# Patient Record
Sex: Female | Born: 1980 | Race: White | Hispanic: No | Marital: Single | Smoking: Current every day smoker
Health system: Southern US, Community
[De-identification: ages and names within clinical notes are randomized; demographics above are authoritative.]

## PROBLEM LIST (undated history)

## (undated) DIAGNOSIS — R011 Cardiac murmur, unspecified: Secondary | ICD-10-CM

## (undated) DIAGNOSIS — M1990 Unspecified osteoarthritis, unspecified site: Secondary | ICD-10-CM

## (undated) DIAGNOSIS — I499 Cardiac arrhythmia, unspecified: Secondary | ICD-10-CM

## (undated) DIAGNOSIS — IMO0002 Reserved for concepts with insufficient information to code with codable children: Secondary | ICD-10-CM

## (undated) DIAGNOSIS — G43909 Migraine, unspecified, not intractable, without status migrainosus: Secondary | ICD-10-CM

## (undated) DIAGNOSIS — M549 Dorsalgia, unspecified: Secondary | ICD-10-CM

## (undated) DIAGNOSIS — I10 Essential (primary) hypertension: Secondary | ICD-10-CM

---

## 1998-04-10 ENCOUNTER — Emergency Department (HOSPITAL_COMMUNITY): Admission: EM | Admit: 1998-04-10 | Discharge: 1998-04-10 | Payer: Self-pay | Admitting: Emergency Medicine

## 2000-03-27 ENCOUNTER — Emergency Department (HOSPITAL_COMMUNITY): Admission: EM | Admit: 2000-03-27 | Discharge: 2000-03-27 | Payer: Self-pay | Admitting: Emergency Medicine

## 2000-10-30 ENCOUNTER — Inpatient Hospital Stay (HOSPITAL_COMMUNITY): Admission: AD | Admit: 2000-10-30 | Discharge: 2000-10-30 | Payer: Self-pay | Admitting: Obstetrics

## 2000-11-01 ENCOUNTER — Inpatient Hospital Stay (HOSPITAL_COMMUNITY): Admission: AD | Admit: 2000-11-01 | Discharge: 2000-11-01 | Payer: Self-pay | Admitting: *Deleted

## 2001-01-22 ENCOUNTER — Emergency Department (HOSPITAL_COMMUNITY): Admission: EM | Admit: 2001-01-22 | Discharge: 2001-01-22 | Payer: Self-pay

## 2002-10-20 ENCOUNTER — Emergency Department (HOSPITAL_COMMUNITY): Admission: EM | Admit: 2002-10-20 | Discharge: 2002-10-20 | Payer: Self-pay | Admitting: *Deleted

## 2004-03-15 ENCOUNTER — Emergency Department (HOSPITAL_COMMUNITY): Admission: EM | Admit: 2004-03-15 | Discharge: 2004-03-15 | Payer: Self-pay | Admitting: Emergency Medicine

## 2004-11-21 ENCOUNTER — Emergency Department: Payer: Self-pay | Admitting: Emergency Medicine

## 2004-11-30 ENCOUNTER — Ambulatory Visit: Payer: Self-pay | Admitting: Family Medicine

## 2004-12-09 ENCOUNTER — Emergency Department (HOSPITAL_COMMUNITY): Admission: EM | Admit: 2004-12-09 | Discharge: 2004-12-09 | Payer: Self-pay | Admitting: Family Medicine

## 2004-12-14 ENCOUNTER — Ambulatory Visit: Payer: Self-pay | Admitting: Family Medicine

## 2004-12-27 ENCOUNTER — Ambulatory Visit: Payer: Self-pay | Admitting: Internal Medicine

## 2004-12-28 ENCOUNTER — Ambulatory Visit: Payer: Self-pay | Admitting: Family Medicine

## 2005-01-18 ENCOUNTER — Ambulatory Visit: Payer: Self-pay | Admitting: Family Medicine

## 2005-02-21 ENCOUNTER — Emergency Department (HOSPITAL_COMMUNITY): Admission: EM | Admit: 2005-02-21 | Discharge: 2005-02-21 | Payer: Self-pay | Admitting: Emergency Medicine

## 2005-03-15 ENCOUNTER — Emergency Department (HOSPITAL_COMMUNITY): Admission: EM | Admit: 2005-03-15 | Discharge: 2005-03-15 | Payer: Self-pay | Admitting: Family Medicine

## 2005-05-04 ENCOUNTER — Emergency Department (HOSPITAL_COMMUNITY): Admission: EM | Admit: 2005-05-04 | Discharge: 2005-05-04 | Payer: Self-pay | Admitting: *Deleted

## 2005-06-08 ENCOUNTER — Emergency Department (HOSPITAL_COMMUNITY): Admission: EM | Admit: 2005-06-08 | Discharge: 2005-06-08 | Payer: Self-pay | Admitting: Emergency Medicine

## 2005-06-10 ENCOUNTER — Emergency Department (HOSPITAL_COMMUNITY): Admission: EM | Admit: 2005-06-10 | Discharge: 2005-06-10 | Payer: Self-pay | Admitting: Family Medicine

## 2005-09-09 ENCOUNTER — Emergency Department: Payer: Self-pay | Admitting: Emergency Medicine

## 2005-09-10 ENCOUNTER — Ambulatory Visit: Payer: Self-pay | Admitting: Emergency Medicine

## 2005-09-23 ENCOUNTER — Emergency Department (HOSPITAL_COMMUNITY): Admission: EM | Admit: 2005-09-23 | Discharge: 2005-09-23 | Payer: Self-pay | Admitting: Emergency Medicine

## 2006-03-23 ENCOUNTER — Emergency Department (HOSPITAL_COMMUNITY): Admission: EM | Admit: 2006-03-23 | Discharge: 2006-03-23 | Payer: Self-pay | Admitting: Family Medicine

## 2006-07-31 ENCOUNTER — Emergency Department (HOSPITAL_COMMUNITY): Admission: EM | Admit: 2006-07-31 | Discharge: 2006-07-31 | Payer: Self-pay | Admitting: Family Medicine

## 2006-11-17 ENCOUNTER — Emergency Department (HOSPITAL_COMMUNITY): Admission: EM | Admit: 2006-11-17 | Discharge: 2006-11-17 | Payer: Self-pay | Admitting: Family Medicine

## 2007-04-22 ENCOUNTER — Emergency Department (HOSPITAL_COMMUNITY): Admission: EM | Admit: 2007-04-22 | Discharge: 2007-04-22 | Payer: Self-pay | Admitting: *Deleted

## 2007-08-31 ENCOUNTER — Ambulatory Visit: Payer: Self-pay | Admitting: Internal Medicine

## 2007-08-31 LAB — CONVERTED CEMR LAB
ALT: 12 units/L (ref 0–35)
Albumin: 4.4 g/dL (ref 3.5–5.2)
Alkaline Phosphatase: 98 units/L (ref 39–117)
BUN: 12 mg/dL (ref 6–23)
Basophils Absolute: 0 10*3/uL (ref 0.0–0.1)
CO2: 23 meq/L (ref 19–32)
Calcium: 9.4 mg/dL (ref 8.4–10.5)
Eosinophils Absolute: 0.1 10*3/uL (ref 0.0–0.7)
Eosinophils Relative: 2 % (ref 0–5)
Glucose, Bld: 95 mg/dL (ref 70–99)
HCT: 40.4 % (ref 36.0–46.0)
Lymphs Abs: 2.6 10*3/uL (ref 0.7–3.3)
MCHC: 31.9 g/dL (ref 30.0–36.0)
Monocytes Relative: 5 % (ref 3–11)
Potassium: 3.9 meq/L (ref 3.5–5.3)
RBC: 4.79 M/uL (ref 3.87–5.11)
TSH: 2.485 microintl units/mL (ref 0.350–5.50)
Total CHOL/HDL Ratio: 5.3
Total Protein: 7 g/dL (ref 6.0–8.3)
Triglycerides: 507 mg/dL — ABNORMAL HIGH (ref <150)

## 2007-09-02 ENCOUNTER — Ambulatory Visit: Payer: Self-pay | Admitting: *Deleted

## 2007-10-01 ENCOUNTER — Ambulatory Visit: Payer: Self-pay | Admitting: Internal Medicine

## 2007-10-01 ENCOUNTER — Ambulatory Visit (HOSPITAL_COMMUNITY): Admission: RE | Admit: 2007-10-01 | Discharge: 2007-10-01 | Payer: Self-pay | Admitting: Internal Medicine

## 2007-11-09 ENCOUNTER — Ambulatory Visit: Payer: Self-pay | Admitting: Internal Medicine

## 2007-11-24 ENCOUNTER — Ambulatory Visit: Payer: Self-pay | Admitting: Internal Medicine

## 2007-11-24 LAB — CONVERTED CEMR LAB
LDL Cholesterol: 81 mg/dL (ref 0–99)
Triglycerides: 218 mg/dL — ABNORMAL HIGH (ref <150)
VLDL: 44 mg/dL — ABNORMAL HIGH (ref 0–40)

## 2007-12-14 ENCOUNTER — Ambulatory Visit: Payer: Self-pay | Admitting: Internal Medicine

## 2007-12-14 LAB — CONVERTED CEMR LAB: GC Probe Amp, Genital: NEGATIVE

## 2008-01-14 ENCOUNTER — Ambulatory Visit: Payer: Self-pay | Admitting: Family Medicine

## 2008-02-03 ENCOUNTER — Encounter: Admission: RE | Admit: 2008-02-03 | Discharge: 2008-04-19 | Payer: Self-pay | Admitting: Family Medicine

## 2008-02-15 ENCOUNTER — Ambulatory Visit: Payer: Self-pay | Admitting: Internal Medicine

## 2008-02-18 ENCOUNTER — Ambulatory Visit: Payer: Self-pay | Admitting: Internal Medicine

## 2008-03-14 ENCOUNTER — Ambulatory Visit: Payer: Self-pay | Admitting: Internal Medicine

## 2008-05-22 ENCOUNTER — Emergency Department (HOSPITAL_COMMUNITY): Admission: EM | Admit: 2008-05-22 | Discharge: 2008-05-22 | Payer: Self-pay | Admitting: Family Medicine

## 2008-05-26 ENCOUNTER — Ambulatory Visit: Payer: Self-pay | Admitting: Internal Medicine

## 2008-06-30 ENCOUNTER — Ambulatory Visit: Payer: Self-pay | Admitting: Internal Medicine

## 2008-06-30 LAB — CONVERTED CEMR LAB
Chlamydia, Swab/Urine, PCR: NEGATIVE
GC Probe Amp, Urine: NEGATIVE

## 2008-07-06 ENCOUNTER — Ambulatory Visit (HOSPITAL_COMMUNITY): Admission: RE | Admit: 2008-07-06 | Discharge: 2008-07-06 | Payer: Self-pay | Admitting: Internal Medicine

## 2008-11-15 ENCOUNTER — Encounter (INDEPENDENT_AMBULATORY_CARE_PROVIDER_SITE_OTHER): Payer: Self-pay | Admitting: Adult Health

## 2008-11-15 ENCOUNTER — Ambulatory Visit: Payer: Self-pay | Admitting: Internal Medicine

## 2008-11-15 LAB — CONVERTED CEMR LAB
ALT: 12 units/L (ref 0–35)
Albumin: 4.3 g/dL (ref 3.5–5.2)
Alkaline Phosphatase: 80 units/L (ref 39–117)
CO2: 25 meq/L (ref 19–32)
Chloride: 107 meq/L (ref 96–112)
Cholesterol: 153 mg/dL (ref 0–200)
Creatinine, Ser: 0.74 mg/dL (ref 0.40–1.20)
Glucose, Bld: 89 mg/dL (ref 70–99)
Hemoglobin: 12.8 g/dL (ref 12.0–15.0)
LDL Cholesterol: 76 mg/dL (ref 0–99)
Lymphocytes Relative: 30 % (ref 12–46)
MCHC: 32 g/dL (ref 30.0–36.0)
MCV: 87.1 fL (ref 78.0–100.0)
Monocytes Absolute: 0.4 10*3/uL (ref 0.1–1.0)
Monocytes Relative: 7 % (ref 3–12)
RBC: 4.59 M/uL (ref 3.87–5.11)
Sodium: 143 meq/L (ref 135–145)
Total CHOL/HDL Ratio: 4.8
VLDL: 45 mg/dL — ABNORMAL HIGH (ref 0–40)

## 2008-11-22 ENCOUNTER — Ambulatory Visit (HOSPITAL_COMMUNITY): Admission: RE | Admit: 2008-11-22 | Discharge: 2008-11-22 | Payer: Self-pay | Admitting: Internal Medicine

## 2008-12-01 ENCOUNTER — Encounter: Admission: RE | Admit: 2008-12-01 | Discharge: 2008-12-01 | Payer: Self-pay | Admitting: Internal Medicine

## 2009-03-07 ENCOUNTER — Encounter (INDEPENDENT_AMBULATORY_CARE_PROVIDER_SITE_OTHER): Payer: Self-pay | Admitting: Adult Health

## 2009-03-07 ENCOUNTER — Ambulatory Visit: Payer: Self-pay | Admitting: Internal Medicine

## 2009-03-09 ENCOUNTER — Ambulatory Visit: Payer: Self-pay | Admitting: Internal Medicine

## 2009-03-21 ENCOUNTER — Ambulatory Visit: Payer: Self-pay | Admitting: Internal Medicine

## 2009-04-02 ENCOUNTER — Emergency Department (HOSPITAL_COMMUNITY): Admission: EM | Admit: 2009-04-02 | Discharge: 2009-04-02 | Payer: Self-pay | Admitting: Emergency Medicine

## 2009-05-11 ENCOUNTER — Ambulatory Visit: Payer: Self-pay | Admitting: Internal Medicine

## 2009-05-15 ENCOUNTER — Emergency Department (HOSPITAL_COMMUNITY): Admission: EM | Admit: 2009-05-15 | Discharge: 2009-05-15 | Payer: Self-pay | Admitting: Emergency Medicine

## 2009-06-28 ENCOUNTER — Emergency Department (HOSPITAL_COMMUNITY): Admission: EM | Admit: 2009-06-28 | Discharge: 2009-06-28 | Payer: Self-pay | Admitting: Family Medicine

## 2009-08-10 ENCOUNTER — Emergency Department (HOSPITAL_COMMUNITY): Admission: EM | Admit: 2009-08-10 | Discharge: 2009-08-10 | Payer: Self-pay | Admitting: Emergency Medicine

## 2009-08-25 ENCOUNTER — Emergency Department (HOSPITAL_COMMUNITY): Admission: EM | Admit: 2009-08-25 | Discharge: 2009-08-25 | Payer: Self-pay | Admitting: Emergency Medicine

## 2009-10-18 ENCOUNTER — Emergency Department (HOSPITAL_COMMUNITY): Admission: EM | Admit: 2009-10-18 | Discharge: 2009-10-18 | Payer: Self-pay | Admitting: Emergency Medicine

## 2009-11-04 ENCOUNTER — Emergency Department (HOSPITAL_COMMUNITY): Admission: EM | Admit: 2009-11-04 | Discharge: 2009-11-04 | Payer: Self-pay | Admitting: Emergency Medicine

## 2009-12-12 ENCOUNTER — Encounter (INDEPENDENT_AMBULATORY_CARE_PROVIDER_SITE_OTHER): Payer: Self-pay | Admitting: Adult Health

## 2009-12-12 ENCOUNTER — Ambulatory Visit: Payer: Self-pay | Admitting: Internal Medicine

## 2009-12-12 LAB — CONVERTED CEMR LAB: hCG, Beta Chain, Quant, S: 1889 milliintl units/mL

## 2009-12-21 ENCOUNTER — Encounter: Admission: RE | Admit: 2009-12-21 | Discharge: 2009-12-21 | Payer: Self-pay | Admitting: Internal Medicine

## 2010-01-04 ENCOUNTER — Emergency Department (HOSPITAL_COMMUNITY): Admission: EM | Admit: 2010-01-04 | Discharge: 2010-01-04 | Payer: Self-pay | Admitting: Emergency Medicine

## 2010-01-22 ENCOUNTER — Emergency Department (HOSPITAL_COMMUNITY): Admission: EM | Admit: 2010-01-22 | Discharge: 2010-01-23 | Payer: Self-pay | Admitting: Emergency Medicine

## 2010-02-23 ENCOUNTER — Ambulatory Visit: Payer: Self-pay | Admitting: Internal Medicine

## 2010-03-16 ENCOUNTER — Ambulatory Visit: Payer: Self-pay | Admitting: Family Medicine

## 2010-05-07 ENCOUNTER — Emergency Department (HOSPITAL_COMMUNITY)
Admission: EM | Admit: 2010-05-07 | Discharge: 2010-05-07 | Payer: Self-pay | Source: Home / Self Care | Admitting: Emergency Medicine

## 2010-12-26 ENCOUNTER — Ambulatory Visit (HOSPITAL_COMMUNITY)
Admission: RE | Admit: 2010-12-26 | Discharge: 2010-12-26 | Payer: Self-pay | Source: Home / Self Care | Attending: Family Medicine | Admitting: Family Medicine

## 2011-01-13 ENCOUNTER — Encounter: Payer: Self-pay | Admitting: Internal Medicine

## 2011-01-25 ENCOUNTER — Encounter: Payer: Self-pay | Admitting: Internal Medicine

## 2011-01-25 ENCOUNTER — Other Ambulatory Visit: Payer: Self-pay | Admitting: Family Medicine

## 2011-01-25 ENCOUNTER — Encounter (INDEPENDENT_AMBULATORY_CARE_PROVIDER_SITE_OTHER): Payer: Self-pay | Admitting: *Deleted

## 2011-01-25 LAB — CONVERTED CEMR LAB
Chlamydia, DNA Probe: NEGATIVE
GC Probe Amp, Genital: NEGATIVE

## 2011-02-25 ENCOUNTER — Inpatient Hospital Stay (INDEPENDENT_AMBULATORY_CARE_PROVIDER_SITE_OTHER)
Admission: RE | Admit: 2011-02-25 | Discharge: 2011-02-25 | Disposition: A | Discharge status: Home / Self Care | Payer: Self-pay | Source: Ambulatory Visit | Attending: Family Medicine | Admitting: Family Medicine

## 2011-02-25 ENCOUNTER — Ambulatory Visit (HOSPITAL_COMMUNITY)
Admission: RE | Admit: 2011-02-25 | Discharge: 2011-02-25 | Disposition: A | Discharge status: Home / Self Care | Payer: Self-pay | Source: Ambulatory Visit | Attending: Family Medicine | Admitting: Family Medicine

## 2011-02-25 ENCOUNTER — Other Ambulatory Visit (HOSPITAL_COMMUNITY): Payer: Self-pay | Admitting: Family Medicine

## 2011-02-25 ENCOUNTER — Ambulatory Visit (HOSPITAL_COMMUNITY): Payer: Self-pay

## 2011-02-25 DIAGNOSIS — M79609 Pain in unspecified limb: Secondary | ICD-10-CM | POA: Insufficient documentation

## 2011-02-25 DIAGNOSIS — R52 Pain, unspecified: Secondary | ICD-10-CM

## 2011-02-25 DIAGNOSIS — R1013 Epigastric pain: Secondary | ICD-10-CM

## 2011-02-25 DIAGNOSIS — M7730 Calcaneal spur, unspecified foot: Secondary | ICD-10-CM | POA: Insufficient documentation

## 2011-02-25 LAB — POCT URINALYSIS DIPSTICK
Hgb urine dipstick: NEGATIVE
Ketones, ur: NEGATIVE mg/dL
Nitrite: NEGATIVE
Urobilinogen, UA: 0.2 mg/dL (ref 0.0–1.0)

## 2011-02-25 LAB — OCCULT BLOOD, POC DEVICE: Fecal Occult Bld: NEGATIVE

## 2011-03-10 LAB — CBC
HCT: 33.5 % — ABNORMAL LOW (ref 36.0–46.0)
Hemoglobin: 11.1 g/dL — ABNORMAL LOW (ref 12.0–15.0)
MCHC: 33.2 g/dL (ref 30.0–36.0)
MCV: 85 fL (ref 78.0–100.0)
RBC: 3.94 MIL/uL (ref 3.87–5.11)
WBC: 8.8 10*3/uL (ref 4.0–10.5)

## 2011-03-10 LAB — COMPREHENSIVE METABOLIC PANEL
CO2: 28 mEq/L (ref 19–32)
Calcium: 8.7 mg/dL (ref 8.4–10.5)
Chloride: 105 mEq/L (ref 96–112)
Creatinine, Ser: 0.89 mg/dL (ref 0.4–1.2)
GFR calc non Af Amer: >60 mL/min (ref >60)
Glucose, Bld: 112 mg/dL — ABNORMAL HIGH (ref 70–99)
Total Bilirubin: 0.1 mg/dL — ABNORMAL LOW (ref 0.3–1.2)

## 2011-03-10 LAB — URINALYSIS, ROUTINE W REFLEX MICROSCOPIC
Leukocytes, UA: NEGATIVE
Nitrite: NEGATIVE
Protein, ur: 30 mg/dL — AB
Urobilinogen, UA: 0.2 mg/dL (ref 0.0–1.0)
pH: 6 (ref 5.0–8.0)

## 2011-03-10 LAB — DIFFERENTIAL
Basophils Absolute: 0 10*3/uL (ref 0.0–0.1)
Eosinophils Absolute: 0.1 10*3/uL (ref 0.0–0.7)
Eosinophils Relative: 1 % (ref 0–5)
Lymphocytes Relative: 20 % (ref 12–46)
Lymphs Abs: 1.7 10*3/uL (ref 0.7–4.0)
Neutrophils Relative %: 74 % (ref 43–77)

## 2011-03-10 LAB — URINE MICROSCOPIC-ADD ON

## 2011-03-10 LAB — LIPASE, BLOOD: Lipase: 16 U/L (ref 11–59)

## 2011-03-19 ENCOUNTER — Encounter (INDEPENDENT_AMBULATORY_CARE_PROVIDER_SITE_OTHER): Payer: Self-pay | Admitting: *Deleted

## 2011-03-19 LAB — CONVERTED CEMR LAB
ALT: 9 units/L (ref 0–35)
AST: 11 units/L (ref 0–37)
Alkaline Phosphatase: 70 units/L (ref 39–117)
Creatinine, Ser: 0.8 mg/dL (ref 0.40–1.20)
HCT: 37.9 % (ref 36.0–46.0)
MCV: 86.3 fL (ref 78.0–100.0)
Platelets: 329 10*3/uL (ref 150–400)
RDW: 14.6 % (ref 11.5–15.5)
Sodium: 141 meq/L (ref 135–145)
Total Bilirubin: 0.2 mg/dL — ABNORMAL LOW (ref 0.3–1.2)
Total Protein: 6.7 g/dL (ref 6.0–8.3)
WBC: 8 10*3/uL (ref 4.0–10.5)

## 2011-03-22 ENCOUNTER — Emergency Department (HOSPITAL_COMMUNITY): Payer: Self-pay

## 2011-03-22 ENCOUNTER — Emergency Department (HOSPITAL_COMMUNITY)
Admission: EM | Admit: 2011-03-22 | Discharge: 2011-03-22 | Disposition: A | Discharge status: Home / Self Care | Payer: Self-pay | Attending: Emergency Medicine | Admitting: Emergency Medicine

## 2011-03-22 DIAGNOSIS — Z79899 Other long term (current) drug therapy: Secondary | ICD-10-CM | POA: Insufficient documentation

## 2011-03-22 DIAGNOSIS — F411 Generalized anxiety disorder: Secondary | ICD-10-CM | POA: Insufficient documentation

## 2011-03-22 DIAGNOSIS — R079 Chest pain, unspecified: Secondary | ICD-10-CM | POA: Insufficient documentation

## 2011-03-22 DIAGNOSIS — G8929 Other chronic pain: Secondary | ICD-10-CM | POA: Insufficient documentation

## 2011-03-22 DIAGNOSIS — M549 Dorsalgia, unspecified: Secondary | ICD-10-CM | POA: Insufficient documentation

## 2011-03-22 DIAGNOSIS — F17200 Nicotine dependence, unspecified, uncomplicated: Secondary | ICD-10-CM | POA: Insufficient documentation

## 2011-03-22 DIAGNOSIS — R0602 Shortness of breath: Secondary | ICD-10-CM | POA: Insufficient documentation

## 2011-03-22 LAB — POCT I-STAT, CHEM 8
BUN: 11 mg/dL (ref 6–23)
Calcium, Ion: 1.16 mmol/L (ref 1.12–1.32)
Chloride: 104 mEq/L (ref 96–112)
HCT: 38 % (ref 36.0–46.0)
Sodium: 141 mEq/L (ref 135–145)
TCO2: 25 mmol/L (ref 0–100)

## 2011-03-22 LAB — POCT CARDIAC MARKERS: Myoglobin, poc: 41.7 ng/mL (ref 12–200)

## 2011-03-27 LAB — CBC
HCT: 36.5 % (ref 36.0–46.0)
Hemoglobin: 12.7 g/dL (ref 12.0–15.0)
MCHC: 34.8 g/dL (ref 30.0–36.0)
MCV: 84.6 fL (ref 78.0–100.0)
Platelets: 288 10*3/uL (ref 150–400)
RDW: 14.1 % (ref 11.5–15.5)

## 2011-03-27 LAB — DIFFERENTIAL
Basophils Absolute: 0 10*3/uL (ref 0.0–0.1)
Basophils Relative: 1 % (ref 0–1)
Eosinophils Absolute: 0.1 10*3/uL (ref 0.0–0.7)
Eosinophils Relative: 2 % (ref 0–5)
Lymphocytes Relative: 27 % (ref 12–46)
Monocytes Absolute: 0.3 10*3/uL (ref 0.1–1.0)

## 2011-03-27 LAB — POCT I-STAT, CHEM 8
BUN: 8 mg/dL (ref 6–23)
Calcium, Ion: 1.16 mmol/L (ref 1.12–1.32)
Hemoglobin: 13.3 g/dL (ref 12.0–15.0)
TCO2: 25 mmol/L (ref 0–100)

## 2011-03-27 LAB — POCT PREGNANCY, URINE: Preg Test, Ur: POSITIVE

## 2011-03-27 LAB — POCT CARDIAC MARKERS: Troponin i, poc: <0.05 ng/mL (ref 0.00–0.09)

## 2011-03-28 LAB — RAPID STREP SCREEN (MED CTR MEBANE ONLY): Streptococcus, Group A Screen (Direct): NEGATIVE

## 2011-03-30 LAB — WET PREP, GENITAL: Trich, Wet Prep: NONE SEEN

## 2011-03-30 LAB — GC/CHLAMYDIA PROBE AMP, GENITAL: Chlamydia, DNA Probe: NEGATIVE

## 2011-05-01 ENCOUNTER — Emergency Department (HOSPITAL_COMMUNITY)
Admission: EM | Admit: 2011-05-01 | Discharge: 2011-05-01 | Disposition: A | Discharge status: Home / Self Care | Payer: Self-pay | Attending: Emergency Medicine | Admitting: Emergency Medicine

## 2011-05-01 ENCOUNTER — Emergency Department (HOSPITAL_COMMUNITY): Payer: Self-pay

## 2011-05-01 DIAGNOSIS — M549 Dorsalgia, unspecified: Secondary | ICD-10-CM | POA: Insufficient documentation

## 2011-05-01 DIAGNOSIS — IMO0002 Reserved for concepts with insufficient information to code with codable children: Secondary | ICD-10-CM | POA: Insufficient documentation

## 2011-05-01 DIAGNOSIS — G8929 Other chronic pain: Secondary | ICD-10-CM | POA: Insufficient documentation

## 2011-05-01 DIAGNOSIS — W19XXXA Unspecified fall, initial encounter: Secondary | ICD-10-CM | POA: Insufficient documentation

## 2011-05-01 DIAGNOSIS — M79609 Pain in unspecified limb: Secondary | ICD-10-CM | POA: Insufficient documentation

## 2012-02-07 ENCOUNTER — Encounter (HOSPITAL_COMMUNITY): Payer: Self-pay | Admitting: Emergency Medicine

## 2012-02-07 ENCOUNTER — Emergency Department (INDEPENDENT_AMBULATORY_CARE_PROVIDER_SITE_OTHER)
Admission: EM | Admit: 2012-02-07 | Discharge: 2012-02-07 | Disposition: A | Discharge status: Home / Self Care | Payer: Self-pay | Source: Home / Self Care | Attending: Family Medicine | Admitting: Family Medicine

## 2012-02-07 DIAGNOSIS — J329 Chronic sinusitis, unspecified: Secondary | ICD-10-CM

## 2012-02-07 DIAGNOSIS — J111 Influenza due to unidentified influenza virus with other respiratory manifestations: Secondary | ICD-10-CM

## 2012-02-07 DIAGNOSIS — F17200 Nicotine dependence, unspecified, uncomplicated: Secondary | ICD-10-CM

## 2012-02-07 MED ORDER — GUAIFENESIN ER 600 MG PO TB12: 1200.0000 mg | ORAL_TABLET | Freq: Two times a day (BID) | ORAL | Status: DC

## 2012-02-07 MED ORDER — LEVOFLOXACIN 500 MG PO TABS: 500.0000 mg | ORAL_TABLET | Freq: Every day | ORAL | Status: AC

## 2012-02-07 NOTE — ED Provider Notes (Signed)
History     CSN: [id number]  Arrival date & time 02/07/12  1151   First MD Initiated Contact with Patient 02/07/12 1201      Chief Complaint  Patient presents with  . Sinusitis  . URI    (Consider location/radiation/quality/duration/timing/severity/associated sxs/prior treatment) Patient is a 31 y.o. female presenting with sinusitis and URI. The history is provided by the patient.  Sinusitis  This is a chronic problem. The current episode started more than 2 days ago. The problem has not changed since onset.There has been no fever. The patient is experiencing no pain. Associated symptoms include congestion, sore throat and cough. Associated symptoms comments: Pt is smoker.. The treatment provided no relief.  URI The primary symptoms include sore throat and cough.  Symptoms associated with the illness include congestion and rhinorrhea.    History reviewed. No pertinent past medical history.  History reviewed. No pertinent past surgical history.  No family history on file.  History  Substance Use Topics  . Smoking status: Current Everyday Smoker  . Smokeless tobacco: Not on file  . Alcohol Use: Yes    OB History    Grav Para Term Preterm Abortions TAB SAB Ect Mult Living                  Review of Systems  Constitutional: Negative.   HENT: Positive for congestion, sore throat, rhinorrhea and postnasal drip.   Respiratory: Positive for cough.   Gastrointestinal: Negative.   Genitourinary: Negative.     Allergies  Amoxicillin; Clindamycin/lincomycin; Doxycycline; Flagyl; Neurontin; Penicillins; Prednisone; Septra; Tramadol; and Ultram  Home Medications   Current Outpatient Rx  Name Route Sig Dispense Refill  . HYDROCODONE-ACETAMINOPHEN 5-325 MG PO TABS Oral Take 1 tablet by mouth every 6 (six) hours as needed.    . GUAIFENESIN ER 600 MG PO TB12 Oral Take 2 tablets (1,200 mg total) by mouth 2 (two) times daily. 30 tablet 1  . LEVOFLOXACIN 500 MG PO TABS Oral  Take 1 tablet (500 mg total) by mouth daily. 10 tablet 0    BP 120/70  Pulse 82  Temp(Src) 98.7 F (37.1 C) (Oral)  Resp 16  SpO2 98%  LMP 01/31/2012  Physical Exam  Nursing note and vitals reviewed. Constitutional: She is oriented to person, place, and time. She appears well-developed and well-nourished.       Smells of smoking.  HENT:  Head: Normocephalic.  Right Ear: External ear normal.  Left Ear: External ear normal.  Nose: Nose normal.  Mouth/Throat: Oropharynx is clear and moist.  Eyes: EOM are normal. Pupils are equal, round, and reactive to light.  Neck: Normal range of motion. Neck supple.  Cardiovascular: Normal rate, regular rhythm, normal heart sounds and intact distal pulses.   Pulmonary/Chest: She has rhonchi.  Abdominal: Soft. Bowel sounds are normal.  Lymphadenopathy:    She has no cervical adenopathy.  Neurological: She is alert and oriented to person, place, and time.  Skin: Skin is warm and dry.    ED Course  Procedures (including critical care time)  Labs Reviewed - No data to display No results found.   1. Sinusitis   2. Bronchitis with flu       MDM          Barkley Bruns, MD 02/22/12 [phone]

## 2012-02-07 NOTE — ED Notes (Signed)
Here with uri sx that started on Sunday with cough,sore throat and cough but has progressed with chest congestion,sinus pressure and greenish mucous,body aches.afebrile.pt tried otc mucinex and cold meds but not working

## 2012-05-29 ENCOUNTER — Other Ambulatory Visit (HOSPITAL_COMMUNITY): Payer: Self-pay | Admitting: Family Medicine

## 2012-05-29 ENCOUNTER — Other Ambulatory Visit: Payer: Self-pay | Admitting: Family Medicine

## 2012-05-29 ENCOUNTER — Ambulatory Visit
Admission: RE | Admit: 2012-05-29 | Discharge: 2012-05-29 | Disposition: A | Discharge status: Home / Self Care | Payer: No Typology Code available for payment source | Source: Ambulatory Visit | Attending: Family Medicine | Admitting: Family Medicine

## 2012-05-29 ENCOUNTER — Ambulatory Visit (HOSPITAL_COMMUNITY)
Admission: RE | Admit: 2012-05-29 | Discharge: 2012-05-29 | Disposition: A | Discharge status: Home / Self Care | Payer: Self-pay | Source: Ambulatory Visit | Attending: Family Medicine | Admitting: Family Medicine

## 2012-05-29 DIAGNOSIS — M25559 Pain in unspecified hip: Secondary | ICD-10-CM

## 2012-05-29 DIAGNOSIS — M545 Low back pain, unspecified: Secondary | ICD-10-CM

## 2012-05-29 DIAGNOSIS — R52 Pain, unspecified: Secondary | ICD-10-CM

## 2012-05-31 ENCOUNTER — Other Ambulatory Visit: Payer: Self-pay

## 2012-08-21 ENCOUNTER — Encounter (HOSPITAL_COMMUNITY): Payer: Self-pay

## 2012-08-21 ENCOUNTER — Emergency Department (HOSPITAL_COMMUNITY)
Admission: EM | Admit: 2012-08-21 | Discharge: 2012-08-21 | Disposition: A | Discharge status: Home / Self Care | Payer: Self-pay | Attending: Emergency Medicine | Admitting: Emergency Medicine

## 2012-08-21 DIAGNOSIS — L600 Ingrowing nail: Secondary | ICD-10-CM | POA: Insufficient documentation

## 2012-08-21 DIAGNOSIS — Z880 Allergy status to penicillin: Secondary | ICD-10-CM | POA: Insufficient documentation

## 2012-08-21 DIAGNOSIS — F17200 Nicotine dependence, unspecified, uncomplicated: Secondary | ICD-10-CM | POA: Insufficient documentation

## 2012-08-21 DIAGNOSIS — M129 Arthropathy, unspecified: Secondary | ICD-10-CM | POA: Insufficient documentation

## 2012-08-21 DIAGNOSIS — IMO0002 Reserved for concepts with insufficient information to code with codable children: Secondary | ICD-10-CM | POA: Insufficient documentation

## 2012-08-21 DIAGNOSIS — Z887 Allergy status to serum and vaccine status: Secondary | ICD-10-CM | POA: Insufficient documentation

## 2012-08-21 HISTORY — DX: Unspecified osteoarthritis, unspecified site: M19.90

## 2012-08-21 HISTORY — DX: Reserved for concepts with insufficient information to code with codable children: IMO0002

## 2012-08-21 HISTORY — DX: Dorsalgia, unspecified: M54.9

## 2012-08-21 MED ORDER — HYDROCODONE-ACETAMINOPHEN 5-325 MG PO TABS: 1.0000 | ORAL_TABLET | Freq: Four times a day (QID) | ORAL | Status: AC | PRN

## 2012-08-21 MED ORDER — LEVOFLOXACIN 250 MG PO TABS: 500.0000 mg | ORAL_TABLET | Freq: Every day | ORAL | Status: AC

## 2012-08-21 NOTE — ED Notes (Signed)
Pt insists she has been taking Vicodin for her back pain but ran out and unable to get a refill d/t her PMD office closing.  She says she is not allergic to tylenol or motrin either.

## 2012-08-21 NOTE — Discharge Instructions (Signed)
°  Infected Ingrown Toenail An infected ingrown toenail occurs when the nail edge grows into the skin and bacteria invade the area. Symptoms include pain, tenderness, swelling, and pus drainage from the edge of the nail. Poorly fitting shoes, minor injuries, and improper cutting of the toenail may also contribute to the problem. You should cut your toenails squarely instead of rounding the edges. Do not cut them too short. Avoid tight or pointed toe shoes. Sometimes the ingrown portion of the nail must be removed. If your toenail is removed, it can take 3-4 months for it to re-grow. HOME CARE INSTRUCTIONS   Soak your infected toe in warm water for 20-30 minutes, 2 to 3 times a day.   Packing or dressings applied to the area should be changed daily.   Take medicine as directed and finish them.   Reduce activities and keep your foot elevated when able to reduce swelling and discomfort. Do this until the infection gets better.   Wear sandals or go barefoot as much as possible while the infected area is sensitive.   See your caregiver for follow-up care in 2-3 days if the infection is not better.  SEEK MEDICAL CARE IF:  Your toe is becoming more red, swollen or painful. MAKE SURE YOU:   Understand these instructions.   Will watch your condition.   Will get help right away if you are not doing well or get worse.  Document Released: 01/16/2005 Document Revised: 11/28/2011 Document Reviewed: 12/05/2008

## 2012-08-21 NOTE — ED Provider Notes (Signed)
History     CSN: [id number]  Arrival date & time 08/21/12  1415   First MD Initiated Contact with Patient 08/21/12 1505      Chief Complaint  Patient presents with  . Toe Pain    (Consider location/radiation/quality/duration/timing/severity/associated sxs/prior treatment) HPI Comments: Pt to ER c/o right ingrown toenail of great toe. Pt has been soaking with epsom salt without relief.   Patient is a 31 y.o. female presenting with toe pain.  Toe Pain This is a recurrent problem. The current episode started more than 1 month ago. The problem occurs constantly. The problem has been gradually worsening. Pertinent negatives include no chills, congestion, coughing, diaphoresis, fever, headaches, myalgias, neck pain, numbness or weakness.    Past Medical History  Diagnosis Date  . Back pain   . Arthritis   . DDD (degenerative disc disease)     No past surgical history on file.  No family history on file.  History  Substance Use Topics  . Smoking status: Current Everyday Smoker -- 0.2 packs/day    Types: Cigarettes  . Smokeless tobacco: Not on file  . Alcohol Use: Yes     once every 6 months    OB History    Grav Para Term Preterm Abortions TAB SAB Ect Mult Living                  Review of Systems  Constitutional: Negative for fever, chills, diaphoresis and activity change.  HENT: Negative for congestion and neck pain.   Respiratory: Negative for cough.   Genitourinary: Negative for dysuria.  Musculoskeletal: Negative for myalgias.  Skin: Negative for color change and wound.  Neurological: Negative for weakness, numbness and headaches.  All other systems reviewed and are negative.    Allergies  Aleve cold &; Amoxicillin; Chloraseptic sore throat; Clindamycin/lincomycin; Doxycycline; Flagyl; Influenza vaccines; Neurontin; Nubain; Penicillins; Phenergan; Prednisone; Septra; and Ultram  Home Medications  No current outpatient prescriptions on file.  BP 136/83   Pulse 95  Temp 99.1 F (37.3 C) (Oral)  Resp 16  SpO2 98%  LMP 08/07/2012  Physical Exam  Nursing note and vitals reviewed. Constitutional: She is oriented to person, place, and time. She appears well-developed and well-nourished. No distress.  HENT:  Head: Normocephalic and atraumatic.  Eyes: Conjunctivae and EOM are normal.  Neck: Normal range of motion.  Pulmonary/Chest: Effort normal.  Musculoskeletal: Normal range of motion.  Neurological: She is alert and oriented to person, place, and time.  Skin: Skin is warm and dry. No rash noted. She is not diaphoretic.       Swelling & erythema along lateral nail fold w/edge of nail embedded in nail fold   Psychiatric: She has a normal mood and affect. Her behavior is normal.    ED Course  NAIL REMOVAL Date/Time: 08/21/2012 5:23 PM Performed by: Jaci Carrel Authorized by: Jaci Carrel Consent: Verbal consent obtained. Written consent not obtained. Risks and benefits: risks, benefits and alternatives were discussed Consent given by: patient Patient understanding: patient states understanding of the procedure being performed Patient consent: the patient's understanding of the procedure matches consent given Patient identity confirmed: verbally with patient and arm band Location: right foot Location details: right big toe Anesthesia: digital block Local anesthetic: lidocaine 2% without epinephrine Anesthetic total: 3 ml Patient sedated: no Preparation: skin prepped with alcohol Amount removed: partial Nail removed location: both. Nail bed sutured: no Nail matrix removed: partial Dressing: gauze roll Patient tolerance: Patient tolerated the procedure well with  no immediate complications.   (including critical care time)  Labs Reviewed - No data to display No results found.   No diagnosis found.    MDM  Ingrown toenail  Partial removal of great toe nail right foot d/t ingrown toenail. No purulent drainage evident,  but d/t to hx and erythema pt will be dc w abx and podiatry f-u as needed. At this time there does not appear to be any evidence of an acute emergency medical condition and the patient appears stable for discharge with appropriate outpatient follow up.Diagnosis was discussed with patient who verbalizes understanding and is agreeable to discharge  Jaci Carrel, PA-C 08/21/12 1726

## 2012-08-21 NOTE — ED Notes (Signed)
Pain to RT great toe x 2 months after clipping toe nail.  Had been soaking it w/epsom salts and swelling went down but tenderness remained.  Last week, clipped nails again and pain has returned.  States she bumped it a week ago and has had purulent/sanguious drainage this past Monday.

## 2012-08-22 NOTE — ED Provider Notes (Signed)
Medical screening examination/treatment/procedure(s) were performed by non-physician practitioner and as supervising physician I was immediately available for consultation/collaboration.  Tuleen Mandelbaum, MD 08/22/12 0011 

## 2012-08-22 NOTE — ED Notes (Signed)
Patient called requesting change in antibiotic due to Levaquin too expensive. RX  Cipro 500 mg  PO BID x 10 days, QS, no refills, prescriber Johnnette Gourd PA called to [facility] [phone].

## 2012-09-25 ENCOUNTER — Emergency Department: Payer: Self-pay | Admitting: Unknown Physician Specialty

## 2013-02-19 ENCOUNTER — Encounter (HOSPITAL_COMMUNITY): Payer: Self-pay | Admitting: *Deleted

## 2013-02-19 ENCOUNTER — Emergency Department (HOSPITAL_COMMUNITY)
Admission: EM | Admit: 2013-02-19 | Discharge: 2013-02-19 | Disposition: A | Discharge status: Home / Self Care | Payer: Self-pay | Attending: Emergency Medicine | Admitting: Emergency Medicine

## 2013-02-19 DIAGNOSIS — G8929 Other chronic pain: Secondary | ICD-10-CM | POA: Insufficient documentation

## 2013-02-19 DIAGNOSIS — M542 Cervicalgia: Secondary | ICD-10-CM | POA: Insufficient documentation

## 2013-02-19 DIAGNOSIS — M545 Low back pain, unspecified: Secondary | ICD-10-CM | POA: Insufficient documentation

## 2013-02-19 DIAGNOSIS — Z8739 Personal history of other diseases of the musculoskeletal system and connective tissue: Secondary | ICD-10-CM | POA: Insufficient documentation

## 2013-02-19 DIAGNOSIS — R209 Unspecified disturbances of skin sensation: Secondary | ICD-10-CM | POA: Insufficient documentation

## 2013-02-19 DIAGNOSIS — F17200 Nicotine dependence, unspecified, uncomplicated: Secondary | ICD-10-CM | POA: Insufficient documentation

## 2013-02-19 MED ORDER — METHOCARBAMOL 500 MG PO TABS: 500.0000 mg | ORAL_TABLET | Freq: Two times a day (BID) | ORAL | Status: DC

## 2013-02-19 MED ORDER — HYDROCODONE-ACETAMINOPHEN 5-325 MG PO TABS: 2.0000 | ORAL_TABLET | Freq: Four times a day (QID) | ORAL | Status: DC | PRN

## 2013-02-19 NOTE — ED Provider Notes (Signed)
History     CSN: [id number]  Arrival date & time 02/19/13  1125   First MD Initiated Contact with Patient 02/19/13 1131      Chief Complaint  Patient presents with  . Back Pain  . Neck Pain    (Consider location/radiation/quality/duration/timing/severity/associated sxs/prior treatment) HPI Comments: Patient with a history of Degenerate Disc Disease and a bulging disc presents today with lower back pain.  Pain associated with intermittent numbness of the left leg.  She has had the pain for several years, but reports that the pain has been worse over the past week.  No acute injury or trauma.  She was formally a patient at [facility] and had been having her pain managed there.  She has been taking 800 mg Ibuprofen for her pain without relief.  She reports that she has been referred to a Neurosurgeon at [facility] in the past, but was told that surgery was not indicated.  She had a MRI in June 2013, which showed bulging disc and disc degeneration at multiple levels.    Patient is a 32 y.o. female presenting with back pain and neck pain. The history is provided by the patient.  Back Pain Location:  Lumbar spine Quality:  Stabbing Onset quality:  Gradual Timing:  Constant Progression:  Worsening Chronicity:  Recurrent Context: not recent injury   Worsened by:  Bending and ambulation Associated symptoms: numbness   Associated symptoms: no abdominal pain, no bladder incontinence, no bowel incontinence, no dysuria, no fever, no paresthesias, no perianal numbness, no weakness and no weight loss   Risk factors: no hx of cancer   Risk factors comment:  No history of IV drug use Neck Pain Associated symptoms: numbness   Associated symptoms: no bladder incontinence, no bowel incontinence, no fever, no weakness and no weight loss     Past Medical History  Diagnosis Date  . Back pain   . Arthritis   . DDD (degenerative disc disease)     History reviewed. No pertinent past surgical  history.  No family history on file.  History  Substance Use Topics  . Smoking status: Current Every Day Smoker -- 0.25 packs/day    Types: Cigarettes  . Smokeless tobacco: Not on file  . Alcohol Use: Yes     Comment: once every 6 months    OB History   Grav Para Term Preterm Abortions TAB SAB Ect Mult Living                  Review of Systems  Constitutional: Negative for fever, chills and weight loss.  HENT: Positive for neck pain.   Gastrointestinal: Negative for abdominal pain and bowel incontinence.  Genitourinary: Negative for bladder incontinence and dysuria.       No bowel or bladder incontinence No urinary retention  Musculoskeletal: Positive for back pain.  Neurological: Positive for numbness. Negative for weakness and paresthesias.  All other systems reviewed and are negative.    Allergies  Aleve cold &; Amoxicillin; Chloraseptic sore throat; Clindamycin/lincomycin; Doxycycline; Flagyl; Influenza vaccines; Neurontin; Nubain; Penicillins; Phenergan; Prednisone; Septra; and Ultram  Home Medications   Current Outpatient Rx  Name  Route  Sig  Dispense  Refill  . EPINEPHrine (EPIPEN JR) 0.15 MG/0.3ML injection   Intramuscular   Inject 0.15 mg into the muscle as needed for anaphylaxis.         Marland Kitchen ibuprofen (ADVIL,MOTRIN) 200 MG tablet   Oral   Take 800 mg by mouth every 6 (six)  hours as needed for pain.          Marland Kitchen HYDROcodone-acetaminophen (NORCO/VICODIN) 5-325 MG per tablet   Oral   Take 2 tablets by mouth every 6 (six) hours as needed for pain.   20 tablet   0   . methocarbamol (ROBAXIN) 500 MG tablet   Oral   Take 1 tablet (500 mg total) by mouth 2 (two) times daily.   20 tablet   0     BP 130/88  Pulse 80  Temp(Src) 98.1 F (36.7 C) (Oral)  Resp 16  Ht 5\' 9"  (1.753 m)  Wt 283 lb (128.368 kg)  BMI 41.77 kg/m2  SpO2 100%  LMP 01/22/2013  Physical Exam  Nursing note and vitals reviewed. Constitutional: She is oriented to person,  place, and time. She appears well-developed and well-nourished. No distress.  HENT:  Head: Normocephalic and atraumatic.  Eyes: EOM are normal.  Neck: Normal range of motion and full passive range of motion without pain. Neck supple. No spinous process tenderness and no muscular tenderness present. No rigidity. Normal range of motion present.  Cardiovascular: Normal rate, regular rhythm and intact distal pulses.  Exam reveals no gallop and no friction rub.   No murmur heard. Pulmonary/Chest: Effort normal and breath sounds normal. No respiratory distress. She has no wheezes. She has no rales. She exhibits no tenderness.  Musculoskeletal:       Cervical back: She exhibits normal range of motion, no tenderness, no bony tenderness and no pain.       Thoracic back: She exhibits no tenderness, no bony tenderness and no pain.       Lumbar back: She exhibits tenderness, bony tenderness and pain. She exhibits no spasm and normal pulse.       Right foot: She exhibits no swelling.       Left foot: She exhibits no swelling.  Bilateral lower extremities nontender without color change, baseline range of motion of extremities with intact distal pulses, capillary refill less than 2 seconds bilaterally.  Pt has increased pain w ROM of lumbar spine. Pain w ambulation, no sign of ataxia.  Neurological: She is alert and oriented to person, place, and time. She has normal strength and normal reflexes. No sensory deficit. Gait (no ataxia, slowed and hunched d/t pain ) abnormal.  Sensation at baseline for light touch in all 4 distal extremities, motor symmetric & bilateral 5/5 (hips: abduction, adduction, flexion; knee: flexion & extension; foot: dorsiflexion, plantar flexion, toes: dorsi flexion) Patellar & ankle reflexes intact.   Skin: Skin is warm and dry. No rash noted. She is not diaphoretic. No erythema. No pallor.  Psychiatric: She has a normal mood and affect.    ED Course  Procedures (including critical  care time)  Labs Reviewed - No data to display No results found.   1. Chronic lower back pain       MDM  Patient with back pain.  No neurological deficits and normal neuro exam.  Patient can walk but states is painful.  No loss of bowel or bladder control.  No concern for cauda equina.  No fever, night sweats, weight loss, h/o cancer, IVDU.  RICE protocol and pain medicine indicated and discussed with patient.         Pascal Lux Ola, PA-C 02/19/13 [phone]

## 2013-02-19 NOTE — Progress Notes (Signed)
Pt initially listed as self pay Cm made referral to [facility] liaison who saw pt. Liaison spoke with pt who confirms she is self pay, no insurance coverage liaison  Pt offered services to assist with finding a [place] self pay provider, resources & health reform information Pt informed liaison she works full time for [employer] and is pending insurance coverage after 90 day probational period [employer] updated

## 2013-02-19 NOTE — ED Notes (Signed)
Pt reports hx of degenerative disc, slipped disc, and arthritis. Hx of chronic back pain x14 years, recently began experiencing increased pain in lower back and tailbone, radiates to bilateral shoulders and neck. Describes pain in neck as "charlie horse" with tingling/numbness - happened twice in past 2 weeks. Also reports "knot" in left lower back that occasionally "swells up". Had been followed by doctor for pain control, last seen 1 year ago. Prescribed Vicodin, currently out of meds.

## 2013-02-23 NOTE — ED Provider Notes (Signed)
Medical screening examination/treatment/procedure(s) were performed by non-physician practitioner and as supervising physician I was immediately available for consultation/collaboration.   Flint Melter, MD 02/23/13 [phone]

## 2013-04-15 ENCOUNTER — Emergency Department (HOSPITAL_COMMUNITY): Payer: Self-pay

## 2013-04-15 ENCOUNTER — Encounter (HOSPITAL_COMMUNITY): Payer: Self-pay | Admitting: Emergency Medicine

## 2013-04-15 ENCOUNTER — Emergency Department (HOSPITAL_COMMUNITY)
Admission: EM | Admit: 2013-04-15 | Discharge: 2013-04-15 | Disposition: A | Discharge status: Home / Self Care | Payer: Self-pay | Attending: Emergency Medicine | Admitting: Emergency Medicine

## 2013-04-15 DIAGNOSIS — L600 Ingrowing nail: Secondary | ICD-10-CM | POA: Insufficient documentation

## 2013-04-15 DIAGNOSIS — F17200 Nicotine dependence, unspecified, uncomplicated: Secondary | ICD-10-CM | POA: Insufficient documentation

## 2013-04-15 MED ORDER — HYDROCODONE-ACETAMINOPHEN 5-325 MG PO TABS: 1.0000 | ORAL_TABLET | Freq: Four times a day (QID) | ORAL | Status: DC | PRN

## 2013-04-15 MED ORDER — LIDOCAINE-EPINEPHRINE-TETRACAINE (LET) SOLUTION
3.0000 mL | Freq: Once | NASAL | Status: AC
  Administered 2013-04-15: 3 mL via TOPICAL
  Filled 2013-04-15: qty 3

## 2013-04-15 MED ORDER — CIPROFLOXACIN HCL 500 MG PO TABS: 500.0000 mg | ORAL_TABLET | Freq: Two times a day (BID) | ORAL | Status: DC

## 2013-04-15 NOTE — ED Notes (Signed)
Pt complains of right great toe pain and redness x 2 days

## 2013-04-15 NOTE — ED Provider Notes (Signed)
Medical screening examination/treatment/procedure(s) were performed by non-physician practitioner and as supervising physician I was immediately available for consultation/collaboration.   Loren Racer, MD 04/15/13 2151

## 2013-04-15 NOTE — ED Provider Notes (Signed)
MSE was initiated and I personally evaluated the patient and placed orders (if any) at  3:11 PM on April 15, 2013.  The patient appears stable so that the remainder of the MSE may be completed by another provider.   Patient presents to the ed with cc 2 days of severe R toe pain.  Soaking and otc pain meds without relief.  Patient has had nail reduction of ingrown toenail previously. She has pain with ambulation. TTP of the DIP  And MTP. Will obtain xray to r/o OM.  Case discussed with PA PAZ who will see and evaluate the patient for further work up.  Arthor Captain, PA-C 04/15/13 1514

## 2013-04-15 NOTE — ED Provider Notes (Signed)
Medical screening examination/treatment/procedure(s) were performed by non-physician practitioner and as supervising physician I was immediately available for consultation/collaboration.   Airyana Sprunger L Adrianna Dudas, MD 04/15/13 1537 

## 2013-04-15 NOTE — ED Provider Notes (Signed)
This chart was scribed for non-physician practitioner Jaci Carrel, PA-C working with Loren Racer, MD, by Candelaria Stagers, ED Scribe. This patient was seen in room WTR6/WTR6 and the patient's care was started at 3:58 PM   HPI: Sarah Steele is a 32 y.o. female who presents to the Emergency Department complaining of an ingrown toenail to the right big toe that started two days ago.  Pt reports she has experienced these symptoms before to the same toe which was treated in the ED about two months ago.  She denies fever, sweats, red streaking, chronic steroid use, or HIV.  Pt states that she tried to remove the toenail herself, but was unsuccessful.    ROS: ROS as mentioned in HPI.   Physical Exam: CONSTITUTIONAL: Well developed/well nourished HEAD: Normocephalic/atraumatic EYES: EOMI/PERRL ENMT: Mucous membranes moist NECK: supple no meningeal signs SPINE:entire spine nontender CV: S1/S2 noted, no murmurs/rubs/gallops noted LUNGS: Lungs are clear to auscultation bilaterally, no apparent distress ABDOMEN: soft, nontender, no rebound or guarding GU:no cva tenderness NEURO: Pt is awake/alert, moves all extremitiesx4 EXTREMITIES: pulses normal, full ROM, right medial toe nail ingrown.  SKIN: warm, color normal PSYCH: no abnormalities of mood noted  OBJECTIVE: Patient appears well, normal vital signs. Right nail reveals ingrown edge with tenderness.  ASSESSMENT: ingrown toenail  Performed by: Jaci Carrel, PA-C  PLAN:  Toenail resection performed to right great toe Informed consent is obtained. Using 1% lidocaine without epi, 1.5 ml used, 25 gauge needle used.  Using a tourniquet for hemostasis and sterile instruments, I freed the nail from the nail bed and removed a wedge of the nail including the ingrown portion to the level of the nail skin fold. This was well tolerated, minimal bleeding. Antibiotic ointment and a dressing are applied. Tylenol with Codeine #3, 1-2 tabs po q4h prn pain is  given. Remove the dressing tomorrow and begin frequent soaks, complete her antibiotics and have a follow up visit in a week. Call if pain, erythema fever or bleeding. Wound care and dressing instructions are given.  Ingrown toenail of the right great toe complicated by perionychia   MDM:   Procedure tolerated as above.  Discussed in depth need for follow up with podiatrist as ingrown toenails are recurrent.  Discussed symptoms that would require patient to return.  Will DC with antibiotics and pain medication.   Jaci Carrel, [place] 04/15/13 1656

## 2013-07-29 ENCOUNTER — Emergency Department: Payer: Self-pay | Admitting: Emergency Medicine

## 2013-08-23 ENCOUNTER — Emergency Department: Payer: Self-pay | Admitting: Emergency Medicine

## 2014-07-07 ENCOUNTER — Other Ambulatory Visit: Payer: Self-pay | Admitting: Cardiovascular Disease

## 2014-07-07 ENCOUNTER — Ambulatory Visit
Admission: RE | Admit: 2014-07-07 | Discharge: 2014-07-07 | Disposition: A | Discharge status: Home / Self Care | Payer: BC Managed Care – PPO | Source: Ambulatory Visit | Attending: Cardiovascular Disease | Admitting: Cardiovascular Disease

## 2014-07-07 DIAGNOSIS — M5431 Sciatica, right side: Secondary | ICD-10-CM

## 2014-08-09 ENCOUNTER — Other Ambulatory Visit: Payer: Self-pay | Admitting: Cardiovascular Disease

## 2014-08-09 DIAGNOSIS — M545 Low back pain, unspecified: Secondary | ICD-10-CM

## 2014-08-14 ENCOUNTER — Other Ambulatory Visit: Payer: BC Managed Care – PPO

## 2014-08-20 ENCOUNTER — Inpatient Hospital Stay: Admission: RE | Admit: 2014-08-20 | Payer: BC Managed Care – PPO | Source: Ambulatory Visit

## 2014-08-27 ENCOUNTER — Other Ambulatory Visit: Payer: Self-pay | Admitting: Cardiovascular Disease

## 2014-08-27 DIAGNOSIS — R52 Pain, unspecified: Secondary | ICD-10-CM

## 2014-09-04 ENCOUNTER — Ambulatory Visit
Admission: RE | Admit: 2014-09-04 | Discharge: 2014-09-04 | Disposition: A | Discharge status: Home / Self Care | Payer: BC Managed Care – PPO | Source: Ambulatory Visit | Attending: Cardiovascular Disease | Admitting: Cardiovascular Disease

## 2014-09-04 DIAGNOSIS — R52 Pain, unspecified: Secondary | ICD-10-CM

## 2015-03-31 ENCOUNTER — Encounter (HOSPITAL_COMMUNITY): Payer: Self-pay | Admitting: *Deleted

## 2015-03-31 ENCOUNTER — Emergency Department (HOSPITAL_COMMUNITY)
Admission: EM | Admit: 2015-03-31 | Discharge: 2015-03-31 | Disposition: A | Discharge status: Home / Self Care | Payer: Self-pay | Attending: Emergency Medicine | Admitting: Emergency Medicine

## 2015-03-31 DIAGNOSIS — R519 Headache, unspecified: Secondary | ICD-10-CM

## 2015-03-31 DIAGNOSIS — R51 Headache: Secondary | ICD-10-CM | POA: Insufficient documentation

## 2015-03-31 DIAGNOSIS — J302 Other seasonal allergic rhinitis: Secondary | ICD-10-CM | POA: Insufficient documentation

## 2015-03-31 DIAGNOSIS — Z880 Allergy status to penicillin: Secondary | ICD-10-CM | POA: Insufficient documentation

## 2015-03-31 DIAGNOSIS — Z720 Tobacco use: Secondary | ICD-10-CM | POA: Insufficient documentation

## 2015-03-31 DIAGNOSIS — H9209 Otalgia, unspecified ear: Secondary | ICD-10-CM | POA: Insufficient documentation

## 2015-03-31 DIAGNOSIS — M1990 Unspecified osteoarthritis, unspecified site: Secondary | ICD-10-CM | POA: Insufficient documentation

## 2015-03-31 DIAGNOSIS — Z79899 Other long term (current) drug therapy: Secondary | ICD-10-CM | POA: Insufficient documentation

## 2015-03-31 DIAGNOSIS — Z792 Long term (current) use of antibiotics: Secondary | ICD-10-CM | POA: Insufficient documentation

## 2015-03-31 MED ORDER — KETOROLAC TROMETHAMINE 30 MG/ML IJ SOLN
60.0000 mg | Freq: Once | INTRAMUSCULAR | Status: AC
  Administered 2015-03-31: 60 mg via INTRAMUSCULAR
  Filled 2015-03-31: qty 2

## 2015-03-31 MED ORDER — OXYCODONE-ACETAMINOPHEN 5-325 MG PO TABS
ORAL_TABLET | ORAL | Status: AC
  Filled 2015-03-31: qty 1

## 2015-03-31 MED ORDER — METOCLOPRAMIDE HCL 5 MG/ML IJ SOLN
10.0000 mg | Freq: Once | INTRAMUSCULAR | Status: AC
  Administered 2015-03-31: 10 mg via INTRAMUSCULAR
  Filled 2015-03-31: qty 2

## 2015-03-31 MED ORDER — DIPHENHYDRAMINE HCL 50 MG/ML IJ SOLN
25.0000 mg | Freq: Once | INTRAMUSCULAR | Status: AC
  Administered 2015-03-31: 25 mg via INTRAMUSCULAR
  Filled 2015-03-31: qty 1

## 2015-03-31 MED ORDER — OXYCODONE-ACETAMINOPHEN 5-325 MG PO TABS
1.0000 | ORAL_TABLET | Freq: Once | ORAL | Status: AC
  Administered 2015-03-31: 1 via ORAL

## 2015-03-31 NOTE — Discharge Instructions (Signed)
Allergies °Allergies may happen from anything your body is sensitive to. This may be food, medicines, pollens, chemicals, and nearly anything around you in everyday life that produces allergens. An allergen is anything that causes an allergy producing substance. Heredity is often a factor in causing these problems. This means you may have some of the same allergies as your parents. °Food allergies happen in all age groups. Food allergies are some of the most severe and life threatening. Some common food allergies are cow's milk, seafood, eggs, nuts, wheat, and soybeans. °SYMPTOMS  °· Swelling around the mouth. °· An itchy red rash or hives. °· Vomiting or diarrhea. °· Difficulty breathing. °SEVERE ALLERGIC REACTIONS ARE LIFE-THREATENING. °This reaction is called anaphylaxis. It can cause the mouth and throat to swell and cause difficulty with breathing and swallowing. In severe reactions only a trace amount of food (for example, peanut oil in a salad) may cause death within seconds. °Seasonal allergies occur in all age groups. These are seasonal because they usually occur during the same season every year. They may be a reaction to molds, grass pollens, or tree pollens. Other causes of problems are house dust mite allergens, pet dander, and mold spores. The symptoms often consist of nasal congestion, a runny itchy nose associated with sneezing, and tearing itchy eyes. There is often an associated itching of the mouth and ears. The problems happen when you come in contact with pollens and other allergens. Allergens are the particles in the air that the body reacts to with an allergic reaction. This causes you to release allergic antibodies. Through a chain of events, these eventually cause you to release histamine into the blood stream. Although it is meant to be protective to the body, it is this release that causes your discomfort. This is why you were given anti-histamines to feel better.  If you are unable to  pinpoint the offending allergen, it may be determined by skin or blood testing. Allergies cannot be cured but can be controlled with medicine. °Hay fever is a collection of all or some of the seasonal allergy problems. It may often be treated with simple over-the-counter medicine such as diphenhydramine. Take medicine as directed. Do not drink alcohol or drive while taking this medicine. Check with your caregiver or package insert for child dosages. °If these medicines are not effective, there are many new medicines your caregiver can prescribe. Stronger medicine such as nasal spray, eye drops, and corticosteroids may be used if the first things you try do not work well. Other treatments such as immunotherapy or desensitizing injections can be used if all else fails. Follow up with your caregiver if problems continue. These seasonal allergies are usually not life threatening. They are generally more of a nuisance that can often be handled using medicine. °HOME CARE INSTRUCTIONS  °· If unsure what causes a reaction, keep a diary of foods eaten and symptoms that follow. Avoid foods that cause reactions. °· If hives or rash are present: °· Take medicine as directed. °· You may use an over-the-counter antihistamine (diphenhydramine) for hives and itching as needed. °· Apply cold compresses (cloths) to the skin or take baths in cool water. Avoid hot baths or showers. Heat will make a rash and itching worse. °· If you are severely allergic: °· Following a treatment for a severe reaction, hospitalization is often required for closer follow-up. °· Wear a medic-alert bracelet or necklace stating the allergy. °· You and your family must learn how to give adrenaline or use   an anaphylaxis kit.  If you have had a severe reaction, always carry your anaphylaxis kit or EpiPen with you. Use this medicine as directed by your caregiver if a severe reaction is occurring. Failure to do so could have a fatal outcome. SEEK MEDICAL  CARE IF:  You suspect a food allergy. Symptoms generally happen within 30 minutes of eating a food.  Your symptoms have not gone away within 2 days or are getting worse.  You develop new symptoms.  You want to retest yourself or your child with a food or drink you think causes an allergic reaction. Never do this if an anaphylactic reaction to that food or drink has happened before. Only do this under the care of a caregiver. SEEK IMMEDIATE MEDICAL CARE IF:   You have difficulty breathing, are wheezing, or have a tight feeling in your chest or throat.  You have a swollen mouth, or you have hives, swelling, or itching all over your body.  You have had a severe reaction that has responded to your anaphylaxis kit or an EpiPen. These reactions may return when the medicine has worn off. These reactions should be considered life threatening. MAKE SURE YOU:   Understand these instructions.  Will watch your condition.  Will get help right away if you are not doing well or get worse. Document Released: 03/04/2003 Document Revised: 04/05/2013 Document Reviewed: 08/08/2008 [facility] Patient Information 2015 [place], [place]. This information is not intended to replace advice given to you by your health care provider. Make sure you discuss any questions you have with your health care provider.  General Headache Without Cause A general headache is pain or discomfort felt around the head or neck area. The cause may not be found.  HOME CARE   Keep all doctor visits.  Only take medicines as told by your doctor.  Lie down in a dark, quiet room when you have a headache.  Keep a journal to find out if certain things bring on headaches. For example, write down:  What you eat and drink.  How much sleep you get.  Any change to your diet or medicines.  Relax by getting a massage or doing other relaxing activities.  Put ice or heat packs on the head and neck area as told by your doctor.  Lessen  stress.  Sit up straight. Do not tighten (tense) your muscles.  Quit smoking if you smoke.  Lessen how much alcohol you drink.  Lessen how much caffeine you drink, or stop drinking caffeine.  Eat and sleep on a regular schedule.  Get 7 to 9 hours of sleep, or as told by your doctor.  Keep lights dim if bright lights bother you or make your headaches worse. GET HELP RIGHT AWAY IF:   Your headache becomes really bad.  You have a fever.  You have a stiff neck.  You have trouble seeing.  Your muscles are weak, or you lose muscle control.  You lose your balance or have trouble walking.  You feel like you will pass out (faint), or you pass out.  You have really bad symptoms that are different than your first symptoms.  You have problems with the medicines given to you by your doctor.  Your medicines do not work.  Your headache feels different than the other headaches.  You feel sick to your stomach (nauseous) or throw up (vomit). MAKE SURE YOU:   Understand these instructions.  Will watch your condition.  Will get help right away  if you are not doing well or get worse. Document Released: 09/17/2008 Document Revised: 03/02/2012 Document Reviewed: 11/29/2011 [facility] Patient Information 2015 [place], [place]. This information is not intended to replace advice given to you by your health care provider. Make sure you discuss any questions you have with your health care provider.

## 2015-03-31 NOTE — ED Notes (Signed)
Pt reports having headache since Tuesday that has gotten progressively worse. Having bilateral ear pain, put peroxide in her ears at home hoping to help the pain. Now also has dizziness and nausea. No acute distress noted at this time.

## 2015-03-31 NOTE — ED Provider Notes (Signed)
CSN: [id number]     Arrival date & time 03/31/15  1202 History  This chart was scribed for non-physician practitioner, Glendell Docker, NP working with Francine Graven, DO by Tula Nakayama, ED scribe. This patient was seen in room TR10C/TR10C and the patient's care was started at 1:27 PM   Chief Complaint  Patient presents with  . Headache   The history is provided by the patient. No language interpreter was used.   HPI Comments: Sarah Steele is a 34 y.o. female with a history of migraines and allergies who presents to the Emergency Department complaining of constant, moderate HA that started 3 days ago. She states bilateral ear pain, left worse than right, dizziness and nausea as associated symptoms. Pt has tried peroxide in her ears and Claritin with some relief. Pt has a history of migraines and states her current symptoms are consistent with prior episodes. She denies blurred vision as an associated symptom.   Past Medical History  Diagnosis Date  . Back pain   . Arthritis   . DDD (degenerative disc disease)    History reviewed. No pertinent past surgical history. History reviewed. No pertinent family history. History  Substance Use Topics  . Smoking status: Current Every Day Smoker -- 0.25 packs/day    Types: Cigarettes  . Smokeless tobacco: Not on file  . Alcohol Use: Yes     Comment: once every 6 months   OB History    No data available     Review of Systems  HENT: Positive for ear pain.   Eyes: Negative for visual disturbance.  Gastrointestinal: Positive for nausea.  Neurological: Positive for dizziness and headaches.  All other systems reviewed and are negative.     Allergies  Aleve cold &; Amoxicillin; Chloraseptic sore throat; Clindamycin/lincomycin; Doxycycline; Flagyl; Influenza vaccines; Neurontin; Nubain; Penicillins; Phenergan; Prednisone; Septra; and Ultram  Home Medications   Prior to Admission medications   Medication Sig Start Date End Date Taking?  Authorizing Provider  ciprofloxacin (CIPRO) 500 MG tablet Take 1 tablet (500 mg total) by mouth 2 (two) times daily. One po bid x 7 days 04/15/13   Lisette Paz, PA-C  EPINEPHrine [place] [facility]) 0.15 MG/0.3ML injection Inject 0.15 mg into the muscle as needed for anaphylaxis.    Historical Provider, MD  HYDROcodone-acetaminophen (NORCO/VICODIN) 5-325 MG per tablet Take 2 tablets by mouth every 6 (six) hours as needed for pain. 02/19/13   Hyman Bible, PA-C  HYDROcodone-acetaminophen (NORCO/VICODIN) 5-325 MG per tablet Take 1 tablet by mouth every 6 (six) hours as needed for pain. 04/15/13   Lisette Paz, PA-C  ibuprofen (ADVIL,MOTRIN) 200 MG tablet Take 800 mg by mouth every 6 (six) hours as needed for pain.     Historical Provider, MD  loratadine (CLARITIN) 10 MG tablet Take 10 mg by mouth daily.    Historical Provider, MD  methocarbamol (ROBAXIN) 500 MG tablet Take 1 tablet (500 mg total) by mouth 2 (two) times daily. 02/19/13   Heather Laisure, PA-C   BP 115/82 mmHg  Pulse 82  Temp(Src) 98.8 F (37.1 C) (Oral)  Resp 17  Ht 5\' 9"  (1.753 m)  Wt 250 lb (113.399 kg)  BMI 36.90 kg/m2  SpO2 99%  LMP 03/24/2015 Physical Exam  Constitutional: She is oriented to person, place, and time. She appears well-developed and well-nourished. No distress.  HENT:  Head: Normocephalic and atraumatic.  Right Ear: External ear normal.  Mouth/Throat: Oropharynx is clear and moist.  Fluid noted in the left ear  Eyes:  Conjunctivae and EOM are normal. Pupils are equal, round, and reactive to light.  Neck: Neck supple. No tracheal deviation present.  Cardiovascular: Normal rate.   Pulmonary/Chest: Effort normal. No respiratory distress.  Musculoskeletal: Normal range of motion.  Neurological: She is alert and oriented to person, place, and time. She exhibits normal muscle tone. Coordination normal.  Skin: Skin is warm and dry.  Psychiatric: She has a normal mood and affect. Her behavior is normal.  Nursing note  and vitals reviewed.   ED Course  Procedures   DIAGNOSTIC STUDIES: Oxygen Saturation is 99% on RA, normal by my interpretation.    COORDINATION OF CARE: 1:32 PM Discussed treatment plan with pt which includes Toradol, Reglan and Benadryl. Pt agreed to plan.   Labs Review Labs Reviewed - No data to display  Imaging Review No results found.   EKG Interpretation None      MDM   Final diagnoses:  Headache, unspecified headache type  Seasonal allergies    Pt feeling better after migraine cocktail. Discussed use of claritin for seasonal allergies  I personally performed the services described in this documentation, which was scribed in my presence. The recorded information has been reviewed and is accurate.     Glendell Docker, NP 03/31/15 [place], DO 04/01/15 1606

## 2015-04-06 ENCOUNTER — Emergency Department: Admit: 2015-04-06 | Disposition: A | Discharge status: Home / Self Care | Payer: Self-pay | Admitting: Internal Medicine

## 2015-04-06 LAB — URINALYSIS, COMPLETE
BILIRUBIN, UR: NEGATIVE
GLUCOSE, UR: NEGATIVE mg/dL (ref 0–75)
Ketone: NEGATIVE
NITRITE: NEGATIVE
PH: 5 (ref 4.5–8.0)
Protein: 100
Specific Gravity: 1.028 (ref 1.003–1.030)

## 2015-07-06 ENCOUNTER — Emergency Department: Payer: BLUE CROSS/BLUE SHIELD

## 2015-07-06 ENCOUNTER — Emergency Department
Admission: EM | Admit: 2015-07-06 | Discharge: 2015-07-06 | Disposition: A | Discharge status: Home / Self Care | Payer: BLUE CROSS/BLUE SHIELD | Attending: Emergency Medicine | Admitting: Emergency Medicine

## 2015-07-06 ENCOUNTER — Encounter: Payer: Self-pay | Admitting: *Deleted

## 2015-07-06 DIAGNOSIS — Z880 Allergy status to penicillin: Secondary | ICD-10-CM | POA: Insufficient documentation

## 2015-07-06 DIAGNOSIS — Z720 Tobacco use: Secondary | ICD-10-CM | POA: Diagnosis not present

## 2015-07-06 DIAGNOSIS — Z79899 Other long term (current) drug therapy: Secondary | ICD-10-CM | POA: Insufficient documentation

## 2015-07-06 DIAGNOSIS — M940 Chondrocostal junction syndrome [Tietze]: Secondary | ICD-10-CM | POA: Diagnosis not present

## 2015-07-06 DIAGNOSIS — R0789 Other chest pain: Secondary | ICD-10-CM | POA: Diagnosis present

## 2015-07-06 LAB — CBC
HCT: 38.6 % (ref 35.0–47.0)
Hemoglobin: 12.8 g/dL (ref 12.0–16.0)
MCH: 28.4 pg (ref 26.0–34.0)
MCHC: 33.1 g/dL (ref 32.0–36.0)
MCV: 85.8 fL (ref 80.0–100.0)
Platelets: 314 10*3/uL (ref 150–440)
RBC: 4.51 MIL/uL (ref 3.80–5.20)
RDW: 13.6 % (ref 11.5–14.5)
WBC: 7.3 10*3/uL (ref 3.6–11.0)

## 2015-07-06 LAB — COMPREHENSIVE METABOLIC PANEL
ALT: 10 U/L — ABNORMAL LOW (ref 14–54)
ANION GAP: 6 (ref 5–15)
AST: 12 U/L — AB (ref 15–41)
Albumin: 4.1 g/dL (ref 3.5–5.0)
Alkaline Phosphatase: 70 U/L (ref 38–126)
BILIRUBIN TOTAL: 0.4 mg/dL (ref 0.3–1.2)
BUN: 14 mg/dL (ref 6–20)
CALCIUM: 8.8 mg/dL — AB (ref 8.9–10.3)
CO2: 26 mmol/L (ref 22–32)
CREATININE: 0.88 mg/dL (ref 0.44–1.00)
Chloride: 111 mmol/L (ref 101–111)
Glucose, Bld: 102 mg/dL — ABNORMAL HIGH (ref 65–99)
Potassium: 3.4 mmol/L — ABNORMAL LOW (ref 3.5–5.1)
SODIUM: 143 mmol/L (ref 135–145)
Total Protein: 6.8 g/dL (ref 6.5–8.1)

## 2015-07-06 LAB — TROPONIN I: Troponin I: <0.03 ng/mL (ref <0.031)

## 2015-07-06 NOTE — ED Notes (Signed)
Pt has chest pain since yesterday.  States it feels like a heaviness in the center of chest.  Intermittent sob.  Dry cough reported.  cig smoker.  No n/v/d.  Pt took xanax and motrin without relief of pain.  Pt alert.  Skin warm and dry.

## 2015-07-06 NOTE — Discharge Instructions (Signed)
Costochondritis Costochondritis, sometimes called Tietze syndrome, is a swelling and irritation (inflammation) of the tissue (cartilage) that connects your ribs with your breastbone (sternum). It causes pain in the chest and rib area. Costochondritis usually goes away on its own over time. It can take up to 6 weeks or longer to get better, especially if you are unable to limit your activities. CAUSES  Some cases of costochondritis have no known cause. Possible causes include:  Injury (trauma).  Exercise or activity such as lifting.  Severe coughing. SIGNS AND SYMPTOMS  Pain and tenderness in the chest and rib area.  Pain that gets worse when coughing or taking deep breaths.  Pain that gets worse with specific movements. DIAGNOSIS  Your health care provider will do a physical exam and ask about your symptoms. Chest X-rays or other tests may be done to rule out other problems. TREATMENT  Costochondritis usually goes away on its own over time. Your health care provider may prescribe medicine to help relieve pain. HOME CARE INSTRUCTIONS   Avoid exhausting physical activity. Try not to strain your ribs during normal activity. This would include any activities using chest, abdominal, and side muscles, especially if heavy weights are used.  Apply ice to the affected area for the first 2 days after the pain begins.  Put ice in a plastic bag.  Place a towel between your skin and the bag.  Leave the ice on for 20 minutes, 2-3 times a day.  Only take over-the-counter or prescription medicines as directed by your health care provider. SEEK MEDICAL CARE IF:  You have redness or swelling at the rib joints. These are signs of infection.  Your pain does not go away despite rest or medicine. SEEK IMMEDIATE MEDICAL CARE IF:   Your pain increases or you are very uncomfortable.  You have shortness of breath or difficulty breathing.  You cough up blood.  You have worse chest pains,  sweating, or vomiting.  You have a fever or persistent symptoms for more than 2-3 days.  You have a fever and your symptoms suddenly get worse. MAKE SURE YOU:   Understand these instructions.  Will watch your condition.  Will get help right away if you are not doing well or get worse. Document Released: 09/18/2005 Document Revised: 09/29/2013 Document Reviewed: 07/13/2013 [employer] Patient Information 2015 [employer]. This information is not intended to replace advice given to you by your health care provider. Make sure you discuss any questions you have with your health care provider.  

## 2015-07-06 NOTE — ED Notes (Signed)
Pt reports CP, SOB, cough x 1 day.  Pt reports CP central location.  Pt hx smoking, denies other med hx.  Pt NAD at this time.

## 2015-07-06 NOTE — ED Provider Notes (Signed)
[facility] Emergency Department Provider Note  ____________________________________________  Time seen: 3:30 AM  I have reviewed the triage vital signs and the nursing notes.   HISTORY  Chief Complaint Chest Pain     HPI Sarah Steele is a 34 y.o. female presents with central chest pain radiating to the left chest times one day patient also admits to a nonproductive cough. Patient admits to being "under a lot of stress". She stated that she thought it was anxiety as such she took a Xanax without relief. Patient denies any leg pain or swelling no history of DVTs.    Past Medical History  Diagnosis Date  . Back pain   . Arthritis   . DDD (degenerative disc disease)     There are no active problems to display for this patient.   No past surgical history on file.  Current Outpatient Rx  Name  Route  Sig  Dispense  Refill  . ALPRAZolam (XANAX) 0.25 MG tablet   Oral   Take 0.25 mg by mouth 3 (three) times daily as needed for anxiety.         Marland Kitchen ibuprofen (ADVIL,MOTRIN) 200 MG tablet   Oral   Take 800 mg by mouth every 6 (six) hours as needed for pain.          Marland Kitchen loratadine (CLARITIN) 10 MG tablet   Oral   Take 10 mg by mouth daily.         . ciprofloxacin (CIPRO) 500 MG tablet   Oral   Take 1 tablet (500 mg total) by mouth 2 (two) times daily. One po bid x 7 days   14 tablet   0   . EPINEPHrine (EPIPEN JR) 0.15 MG/0.3ML injection   Intramuscular   Inject 0.15 mg into the muscle as needed for anaphylaxis.         Marland Kitchen HYDROcodone-acetaminophen (NORCO/VICODIN) 5-325 MG per tablet   Oral   Take 2 tablets by mouth every 6 (six) hours as needed for pain.   20 tablet   0   . HYDROcodone-acetaminophen (NORCO/VICODIN) 5-325 MG per tablet   Oral   Take 1 tablet by mouth every 6 (six) hours as needed for pain.   15 tablet   0   . methocarbamol (ROBAXIN) 500 MG tablet   Oral   Take 1 tablet (500 mg total) by mouth 2 (two) times daily.  20 tablet   0     Allergies Aleve cold &; Amoxicillin; Chloraseptic sore throat; Clindamycin/lincomycin; Doxycycline; Flagyl; Influenza vaccines; Neurontin; Nubain; Penicillins; Phenergan; Prednisone; Septra; and Ultram  No family history on file.  Social History History  Substance Use Topics  . Smoking status: Current Every Day Smoker -- 0.25 packs/day    Types: Cigarettes  . Smokeless tobacco: Not on file  . Alcohol Use: Yes     Comment: once every 6 months    Review of Systems  Constitutional: Negative for fever. Eyes: Negative for visual changes. ENT: Negative for sore throat. Cardiovascular: Positive for chest pain. Respiratory: Negative for shortness of breath. Gastrointestinal: Negative for abdominal pain, vomiting and diarrhea. Genitourinary: Negative for dysuria. Musculoskeletal: Negative for back pain. Skin: Negative for rash. Neurological: Negative for headaches, focal weakness or numbness.   10-point ROS otherwise negative.  ____________________________________________   PHYSICAL EXAM:  VITAL SIGNS: ED Triage Vitals  Enc Vitals Group     BP 07/06/15 0021 121/75 mmHg     Pulse Rate 07/06/15 0021 75  Resp 07/06/15 0021 18     Temp 07/06/15 0021 98.2 F (36.8 C)     Temp Source 07/06/15 0021 Oral     SpO2 07/06/15 0021 98 %     Weight 07/06/15 0021 241 lb (109.317 kg)     Height 07/06/15 0021 5\' 9"  (1.753 m)     Head Cir --      Peak Flow --      Pain Score 07/06/15 0022 9     Pain Loc --      Pain Edu? --      Excl. in [place]? --      Constitutional: Alert and oriented. Well appearing and in no distress. Eyes: Conjunctivae are normal. PERRL. Normal extraocular movements. ENT   Head: Normocephalic and atraumatic.   Nose: No congestion/rhinnorhea.   Mouth/Throat: Mucous membranes are moist.   Neck: No stridor. Hematological/Lymphatic/Immunilogical: No cervical lymphadenopathy. Cardiovascular: Normal rate, regular rhythm. Normal  and symmetric distal pulses are present in all extremities. No murmurs, rubs, or gallops. Pain to palpation parasternal Respiratory: Normal respiratory effort without tachypnea nor retractions. Breath sounds are clear and equal bilaterally. No wheezes/rales/rhonchi. Gastrointestinal: Soft and nontender. No distention. There is no CVA tenderness. Genitourinary: deferred Musculoskeletal: Nontender with normal range of motion in all extremities. No joint effusions.  No lower extremity tenderness nor edema. Neurologic:  Normal speech and language. No gross focal neurologic deficits are appreciated. Speech is normal.  Skin:  Skin is warm, dry and intact. No rash noted. Psychiatric: Mood and affect are normal. Speech and behavior are normal. Patient exhibits appropriate insight and judgment.  ____________________________________________    LABS (pertinent positives/negatives) Labs Reviewed  COMPREHENSIVE METABOLIC PANEL - Abnormal; Notable for the following:    Potassium 3.4 (*)    Glucose, Bld 102 (*)    Calcium 8.8 (*)    AST 12 (*)    ALT 10 (*)    All other components within normal limits  CBC  TROPONIN I     ____________________________________________   EKG  ED ECG REPORT I, Joyous Gleghorn, [place], the attending physician, personally viewed and interpreted this ECG.   Date: 07/06/2015  EKG Time: 12:18 AM  Rate: 73  Rhythm: normal EKG, normal sinus rhythm, unchanged from previous tracings  Axis: Normal  Intervals:none  ST&T Change: None   ____________________________________________    RADIOLOGY  Chest x-ray read as negative per radiologist  ____________________________________________     INITIAL IMPRESSION / ASSESSMENT AND PLAN / ED COURSE  Pertinent labs & imaging results that were available during my care of the patient were reviewed by me and considered in my medical decision making (see chart for details).  Strip physical exam consistent with  costochondritis as such patient received ibuprofen 800 mg in the emergency department. Considered other etiologies such as myocardial ischemia versus infarct however EKG unremarkable troponin negative. Also consider the possibility of a pulmonary emboli however this is unlikely given low probability  ____________________________________________   FINAL CLINICAL IMPRESSION(S) / ED DIAGNOSES  Final diagnoses:  Costochondritis, acute      Gregor Hams, MD 07/06/15 0500

## 2015-08-24 ENCOUNTER — Ambulatory Visit
Admission: RE | Admit: 2015-08-24 | Discharge: 2015-08-24 | Disposition: A | Discharge status: Home / Self Care | Payer: BLUE CROSS/BLUE SHIELD | Source: Ambulatory Visit | Attending: Cardiovascular Disease | Admitting: Cardiovascular Disease

## 2015-08-24 ENCOUNTER — Other Ambulatory Visit: Payer: Self-pay | Admitting: Cardiovascular Disease

## 2015-08-24 DIAGNOSIS — M542 Cervicalgia: Secondary | ICD-10-CM

## 2015-09-14 ENCOUNTER — Emergency Department
Admission: EM | Admit: 2015-09-14 | Discharge: 2015-09-14 | Disposition: A | Discharge status: Home / Self Care | Payer: BLUE CROSS/BLUE SHIELD | Attending: Emergency Medicine | Admitting: Emergency Medicine

## 2015-09-14 ENCOUNTER — Encounter: Payer: Self-pay | Admitting: *Deleted

## 2015-09-14 DIAGNOSIS — G43809 Other migraine, not intractable, without status migrainosus: Secondary | ICD-10-CM | POA: Insufficient documentation

## 2015-09-14 DIAGNOSIS — Z79899 Other long term (current) drug therapy: Secondary | ICD-10-CM | POA: Insufficient documentation

## 2015-09-14 DIAGNOSIS — Z792 Long term (current) use of antibiotics: Secondary | ICD-10-CM | POA: Diagnosis not present

## 2015-09-14 DIAGNOSIS — Z880 Allergy status to penicillin: Secondary | ICD-10-CM | POA: Insufficient documentation

## 2015-09-14 DIAGNOSIS — Z720 Tobacco use: Secondary | ICD-10-CM | POA: Diagnosis not present

## 2015-09-14 DIAGNOSIS — R51 Headache: Secondary | ICD-10-CM | POA: Diagnosis present

## 2015-09-14 MED ORDER — KETOROLAC TROMETHAMINE 60 MG/2ML IM SOLN
60.0000 mg | Freq: Once | INTRAMUSCULAR | Status: AC
  Administered 2015-09-14: 60 mg via INTRAMUSCULAR
  Filled 2015-09-14: qty 2

## 2015-09-14 MED ORDER — DIPHENHYDRAMINE HCL 50 MG/ML IJ SOLN: 25.0000 mg | Freq: Once | INTRAMUSCULAR | Status: DC

## 2015-09-14 MED ORDER — METOCLOPRAMIDE HCL 5 MG/ML IJ SOLN
10.0000 mg | Freq: Once | INTRAMUSCULAR | Status: DC
  Filled 2015-09-14: qty 2

## 2015-09-14 MED ORDER — DIPHENHYDRAMINE HCL 50 MG/ML IJ SOLN
12.5000 mg | Freq: Once | INTRAMUSCULAR | Status: DC
  Filled 2015-09-14: qty 1

## 2015-09-14 MED ORDER — DIPHENHYDRAMINE HCL 25 MG PO CAPS
ORAL_CAPSULE | ORAL | Status: AC
  Filled 2015-09-14: qty 1

## 2015-09-14 MED ORDER — KETOROLAC TROMETHAMINE 30 MG/ML IJ SOLN
30.0000 mg | Freq: Once | INTRAMUSCULAR | Status: DC
  Filled 2015-09-14: qty 1

## 2015-09-14 MED ORDER — DIPHENHYDRAMINE HCL 25 MG PO CAPS
25.0000 mg | ORAL_CAPSULE | Freq: Once | ORAL | Status: AC
  Administered 2015-09-14: 25 mg via ORAL

## 2015-09-14 MED ORDER — BUTALBITAL-APAP-CAFFEINE 50-325-40 MG PO TABS: 1.0000 | ORAL_TABLET | Freq: Four times a day (QID) | ORAL | Status: AC | PRN

## 2015-09-14 MED ORDER — SUMATRIPTAN SUCCINATE 6 MG/0.5ML ~~LOC~~ SOLN
6.0000 mg | Freq: Once | SUBCUTANEOUS | Status: AC
  Administered 2015-09-14: 6 mg via SUBCUTANEOUS
  Filled 2015-09-14: qty 0.5

## 2015-09-14 MED ORDER — SODIUM CHLORIDE 0.9 % IV BOLUS (SEPSIS): 1000.0000 mL | Freq: Once | INTRAVENOUS | Status: DC

## 2015-09-14 NOTE — ED Provider Notes (Signed)
[facility] Emergency Department Provider Note  ____________________________________________  Time seen: Approximately 6:51 PM  I have reviewed the triage vital signs and the nursing notes.   HISTORY  Chief Complaint Migraine    HPI Sarah Steele is a 34 y.o. female who presents with 2 days of severe headache. She has a history of migraines though this is worse than usual. She reports pain coming from the neck as well as the forehead.She reports a throbbing pain with light sensitivity and mild nausea. She reports having neck pain for quite some time and has seen her primary physician. She had a recent x-ray and anticipates follow-up later next week. 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 She denies any fevers or chills. She has mild head congestion but no sinus drainage. She reports mild dizziness and difficulty focusing when changing positions because her head hurts. No abdominal pain. She believes this headache is similar to previous headaches though worse and not responding to her usual medicine.   Past Medical History  Diagnosis Date  . Back pain   . Arthritis   . DDD (degenerative disc disease)     There are no active problems to display for this patient.   History reviewed. No pertinent past surgical history.  Current Outpatient Rx  Name  Route  Sig  Dispense  Refill  . ALPRAZolam (XANAX) 0.25 MG tablet   Oral   Take 0.25 mg by mouth 3 (three) times daily as needed for anxiety.         . butalbital-acetaminophen-caffeine (FIORICET) 50-325-40 MG per tablet   Oral   Take 1-2 tablets by mouth every 6 (six) hours as needed for headache.   20 tablet   0   . ciprofloxacin (CIPRO) 500 MG tablet   Oral   Take 1 tablet (500 mg total) by mouth 2 (two) times daily. One po bid x 7 days   14 tablet   0   . EPINEPHrine (EPIPEN JR) 0.15 MG/0.3ML injection   Intramuscular   Inject 0.15 mg into the muscle  as needed for anaphylaxis.         Marland Kitchen HYDROcodone-acetaminophen (NORCO/VICODIN) 5-325 MG per tablet   Oral   Take 2 tablets by mouth every 6 (six) hours as needed for pain.   20 tablet   0   . HYDROcodone-acetaminophen (NORCO/VICODIN) 5-325 MG per tablet   Oral   Take 1 tablet by mouth every 6 (six) hours as needed for pain.   15 tablet   0   . ibuprofen (ADVIL,MOTRIN) 200 MG tablet   Oral   Take 800 mg by mouth every 6 (six) hours as needed for pain.          Marland Kitchen loratadine (CLARITIN) 10 MG tablet   Oral   Take 10 mg by mouth daily.         . methocarbamol (ROBAXIN) 500 MG tablet   Oral   Take 1 tablet (500 mg total) by mouth 2 (two) times daily.   20 tablet   0     Allergies Aleve cold &; Amoxicillin; Chloraseptic sore throat; Clindamycin/lincomycin; Doxycycline; Flagyl; Influenza vaccines; Neurontin; Nubain; Penicillins; Phenergan; Prednisone; Septra; and Ultram  No family history on file.  Social History Social History  Substance Use Topics  . Smoking status: Current Every Day Smoker -- 0.25 packs/day    Types: Cigarettes  . Smokeless tobacco: None  . Alcohol Use: Yes  Comment: once every 6 months    Review of Systems Constitutional: No fever/chills Eyes:see above ENT: No sore throat. Cardiovascular: Denies chest pain. Respiratory: Denies shortness of breath. Gastrointestinal: No abdominal pain. mild nausea, no vomiting.   Genitourinary: Negative for dysuria. Musculoskeletal: Negative for back pain. Skin: Negative for rash. Neurological: Negative forfocal weakness or numbness.  10-point ROS otherwise negative.  ____________________________________________   PHYSICAL EXAM:  VITAL SIGNS: ED Triage Vitals  Enc Vitals Group     BP 09/14/15 1746 141/116 mmHg     Pulse Rate 09/14/15 1746 70     Resp 09/14/15 1746 16     Temp 09/14/15 1746 98.5 F (36.9 C)     Temp Source 09/14/15 1746 Oral     SpO2 09/14/15 1746 98 %     Weight 09/14/15  1746 241 lb (109.317 kg)     Height 09/14/15 1746 5\' 9"  (1.753 m)     Head Cir --      Peak Flow --      Pain Score 09/14/15 1744 10     Pain Loc --      Pain Edu? --      Excl. in [place]? --     Constitutional: Alert and oriented. Well appearing and in no acute distress. Eyes: Conjunctivae are normal. PERRL. EOMI. Head: Atraumatic. Nose: No congestion/rhinnorhea. Mouth/Throat: Mucous membranes are moist.  Oropharynx non-erythematous. Neck: supple.  Has paracervical tenderness, bilateral. Cardiovascular: Normal rate, regular rhythm. Grossly normal heart sounds.  Good peripheral circulation. Respiratory: Normal respiratory effort.  No retractions. Lungs CTAB. Musculoskeletal: No lower extremity tenderness nor edema.  No joint effusions. Neurologic:  Normal speech and language. No gross focal neurologic deficits are appreciated. No gait instability. Cranial nerves II through XII grossly intact. Skin:  Skin is warm, dry and intact. No rash noted. Psychiatric: Mood and affect are normal. Speech and behavior are normal.  ____________________________________________   LABS (all labs ordered are listed, but only abnormal results are displayed)  Labs Reviewed - No data to display ____________________________________________  EKG   ____________________________________________  RADIOLOGY   ____________________________________________   PROCEDURES  Procedure(s) performed: None  Critical Care performed: No  ____________________________________________   INITIAL IMPRESSION / ASSESSMENT AND PLAN / ED COURSE  Pertinent labs & imaging results that were available during my care of the patient were reviewed by me and considered in my medical decision making (see chart for details).  34 year old female with history of migraines who presents with 2 days of persistent headache not relieved with her typical migraine treatment. She is given Toradol, Imitrex and Benadryl in the emergency  room. She reports improvement in her headache though not complete resolution. She is given a prescription for Fioricet to try if headache persists. She may follow-up with her primary physician for further evaluation. Return to the emergency room for any concerns. ____________________________________________   FINAL CLINICAL IMPRESSION(S) / ED DIAGNOSES  Final diagnoses:  Other migraine without status migrainosus, not intractable      Mortimer Fries, PA-C 09/14/15 2250  Orbie Pyo, MD 09/14/15 [phone]

## 2015-09-14 NOTE — ED Notes (Signed)
Pt reports migraine since yesterday and unable to relieve with OTC medications. Nausea, pain behind eyes.

## 2015-09-14 NOTE — Discharge Instructions (Signed)
Headaches, Frequently Asked Questions MIGRAINE HEADACHES Q: What is migraine? What causes it? How can I treat it? A: Generally, migraine headaches begin as a dull ache. Then they develop into a constant, throbbing, and pulsating pain. You may experience pain at the temples. You may experience pain at the front or back of one or both sides of the head. The pain is usually accompanied by a combination of:  Nausea.  Vomiting.  Sensitivity to light and noise. Some people (about 15%) experience an aura (see below) before an attack. The cause of migraine is believed to be chemical reactions in the brain. Treatment for migraine may include over-the-counter or prescription medications. It may also include self-help techniques. These include relaxation training and biofeedback.  Q: What is an aura? A: About 15% of people with migraine get an "aura". This is a sign of neurological symptoms that occur before a migraine headache. You may see wavy or jagged lines, dots, or flashing lights. You might experience tunnel vision or blind spots in one or both eyes. The aura can include visual or auditory hallucinations (something imagined). It may include disruptions in smell (such as strange odors), taste or touch. Other symptoms include:  Numbness.  A "pins and needles" sensation.  Difficulty in recalling or speaking the correct word. These neurological events may last as long as 60 minutes. These symptoms will fade as the headache begins. Q: What is a trigger? A: Certain physical or environmental factors can lead to or "trigger" a migraine. These include:  Foods.  Hormonal changes.  Weather.  Stress. It is important to remember that triggers are different for everyone. To help prevent migraine attacks, you need to figure out which triggers affect you. Keep a headache diary. This is a good way to track triggers. The diary will help you talk to your healthcare professional about your condition. Q: Does  weather affect migraines? A: Bright sunshine, hot, humid conditions, and drastic changes in barometric pressure may lead to, or "trigger," a migraine attack in some people. But studies have shown that weather does not act as a trigger for everyone with migraines. Q: What is the link between migraine and hormones? A: Hormones start and regulate many of your body's functions. Hormones keep your body in balance within a constantly changing environment. The levels of hormones in your body are unbalanced at times. Examples are during menstruation, pregnancy, or menopause. That can lead to a migraine attack. In fact, about three quarters of all women with migraine report that their attacks are related to the menstrual cycle.  Q: Is there an increased risk of stroke for migraine sufferers? A: The likelihood of a migraine attack causing a stroke is very remote. That is not to say that migraine sufferers cannot have a stroke associated with their migraines. In persons under age 53, the most common associated factor for stroke is migraine headache. But over the course of a person's normal life span, the occurrence of migraine headache may actually be associated with a reduced risk of dying from cerebrovascular disease due to stroke.  Q: What are acute medications for migraine? A: Acute medications are used to treat the pain of the headache after it has started. Examples over-the-counter medications, NSAIDs, ergots, and triptans.  Q: What are the triptans? A: Triptans are the newest class of abortive medications. They are specifically targeted to treat migraine. Triptans are vasoconstrictors. They moderate some chemical reactions in the brain. The triptans work on receptors in your brain. Triptans help  to restore the balance of a neurotransmitter called serotonin. Fluctuations in levels of serotonin are thought to be a main cause of migraine.  °Q: Are over-the-counter medications for migraine effective? °A:  Over-the-counter, or "OTC," medications may be effective in relieving mild to moderate pain and associated symptoms of migraine. But you should see your caregiver before beginning any treatment regimen for migraine.  °Q: What are preventive medications for migraine? °A: Preventive medications for migraine are sometimes referred to as "prophylactic" treatments. They are used to reduce the frequency, severity, and length of migraine attacks. Examples of preventive medications include antiepileptic medications, antidepressants, beta-blockers, calcium channel blockers, and NSAIDs (nonsteroidal anti-inflammatory drugs). °Q: Why are anticonvulsants used to treat migraine? °A: During the past few years, there has been an increased interest in antiepileptic drugs for the prevention of migraine. They are sometimes referred to as "anticonvulsants". Both epilepsy and migraine may be caused by similar reactions in the brain.  °Q: Why are antidepressants used to treat migraine? °A: Antidepressants are typically used to treat people with depression. They may reduce migraine frequency by regulating chemical levels, such as serotonin, in the brain.  °Q: What alternative therapies are used to treat migraine? °A: The term "alternative therapies" is often used to describe treatments considered outside the scope of conventional Western medicine. Examples of alternative therapy include acupuncture, acupressure, and yoga. Another common alternative treatment is herbal therapy. Some herbs are believed to relieve headache pain. Always discuss alternative therapies with your caregiver before proceeding. Some herbal products contain arsenic and other toxins. °TENSION HEADACHES °Q: What is a tension-type headache? What causes it? How can I treat it? °A: Tension-type headaches occur randomly. They are often the result of temporary stress, anxiety, fatigue, or anger. Symptoms include soreness in your temples, a tightening band-like sensation  around your head (a "vice-like" ache). Symptoms can also include a pulling feeling, pressure sensations, and contracting head and neck muscles. The headache begins in your forehead, temples, or the back of your head and neck. Treatment for tension-type headache may include over-the-counter or prescription medications. Treatment may also include self-help techniques such as relaxation training and biofeedback. °CLUSTER HEADACHES °Q: What is a cluster headache? What causes it? How can I treat it? °A: Cluster headache gets its name because the attacks come in groups. The pain arrives with little, if any, warning. It is usually on one side of the head. A tearing or bloodshot eye and a runny nose on the same side of the headache may also accompany the pain. Cluster headaches are believed to be caused by chemical reactions in the brain. They have been described as the most severe and intense of any headache type. Treatment for cluster headache includes prescription medication and oxygen. °SINUS HEADACHES °Q: What is a sinus headache? What causes it? How can I treat it? °A: When a cavity in the bones of the face and skull (a sinus) becomes inflamed, the inflammation will cause localized pain. This condition is usually the result of an allergic reaction, a tumor, or an infection. If your headache is caused by a sinus blockage, such as an infection, you will probably have a fever. An x-ray will confirm a sinus blockage. Your caregiver's treatment might include antibiotics for the infection, as well as antihistamines or decongestants.  °REBOUND HEADACHES °Q: What is a rebound headache? What causes it? How can I treat it? °A: A pattern of taking acute headache medications too often can lead to a condition known as "rebound headache."   A pattern of taking too much headache medication includes taking it more than 2 days per week or in excessive amounts. That means more than the label or a caregiver advises. With rebound  headaches, your medications not only stop relieving pain, they actually begin to cause headaches. Doctors treat rebound headache by tapering the medication that is being overused. Sometimes your caregiver will gradually substitute a different type of treatment or medication. Stopping may be a challenge. Regularly overusing a medication increases the potential for serious side effects. Consult a caregiver if you regularly use headache medications more than 2 days per week or more than the label advises. ADDITIONAL QUESTIONS AND ANSWERS Q: What is biofeedback? A: Biofeedback is a self-help treatment. Biofeedback uses special equipment to monitor your body's involuntary physical responses. Biofeedback monitors:  Breathing.  Pulse.  Heart rate.  Temperature.  Muscle tension.  Brain activity. Biofeedback helps you refine and perfect your relaxation exercises. You learn to control the physical responses that are related to stress. Once the technique has been mastered, you do not need the equipment any more. Q: Are headaches hereditary? A: Four out of five (80%) of people that suffer report a family history of migraine. Scientists are not sure if this is genetic or a family predisposition. Despite the uncertainty, a child has a 50% chance of having migraine if one parent suffers. The child has a 75% chance if both parents suffer.  Q: Can children get headaches? A: By the time they reach high school, most young people have experienced some type of headache. Many safe and effective approaches or medications can prevent a headache from occurring or stop it after it has begun.  Q: What type of doctor should I see to diagnose and treat my headache? A: Start with your primary caregiver. Discuss his or her experience and approach to headaches. Discuss methods of classification, diagnosis, and treatment. Your caregiver may decide to recommend you to a headache specialist, depending upon your symptoms or other  physical conditions. Having diabetes, allergies, etc., may require a more comprehensive and inclusive approach to your headache. The National Head[employer] Foundation will provide, upon request, a list of [facility] physician members in your state. Document Released: 02/29/2004 Document Revised: 03/02/2012 Document Reviewed: 08/08/2008 [facility] Patient Information 2015 [place], [place]. This information is not intended to replace advice given to you by your health care provider. Make sure you discuss any questions you have with your health care provider.   Rest tonight.  If headache persists he may try Fioricet or an over-the-counter medicine. Follow-up with your physician as needed for further evaluation. Return to the emergency room for any concerns.

## 2016-04-19 ENCOUNTER — Emergency Department
Admission: EM | Admit: 2016-04-19 | Discharge: 2016-04-19 | Disposition: A | Discharge status: Home / Self Care | Payer: 59 | Attending: Emergency Medicine | Admitting: Emergency Medicine

## 2016-04-19 DIAGNOSIS — M5442 Lumbago with sciatica, left side: Secondary | ICD-10-CM | POA: Diagnosis not present

## 2016-04-19 DIAGNOSIS — M545 Low back pain: Secondary | ICD-10-CM | POA: Diagnosis present

## 2016-04-19 DIAGNOSIS — M5441 Lumbago with sciatica, right side: Secondary | ICD-10-CM | POA: Diagnosis not present

## 2016-04-19 DIAGNOSIS — M62830 Muscle spasm of back: Secondary | ICD-10-CM | POA: Insufficient documentation

## 2016-04-19 DIAGNOSIS — F17210 Nicotine dependence, cigarettes, uncomplicated: Secondary | ICD-10-CM | POA: Insufficient documentation

## 2016-04-19 DIAGNOSIS — G8929 Other chronic pain: Secondary | ICD-10-CM

## 2016-04-19 HISTORY — DX: Cardiac murmur, unspecified: R01.1

## 2016-04-19 MED ORDER — MELOXICAM 15 MG PO TABS: 15.0000 mg | ORAL_TABLET | Freq: Every day | ORAL | Status: DC

## 2016-04-19 MED ORDER — TIZANIDINE HCL 4 MG PO CAPS: 4.0000 mg | ORAL_CAPSULE | Freq: Three times a day (TID) | ORAL | Status: DC | PRN

## 2016-04-19 NOTE — Discharge Instructions (Signed)
Chronic Back Pain ° When back pain lasts longer than 3 months, it is called chronic back pain. People with chronic back pain often go through certain periods that are more intense (flare-ups).  °CAUSES °Chronic back pain can be caused by wear and tear (degeneration) on different structures in your back. These structures include: °· The bones of your spine (vertebrae) and the joints surrounding your spinal cord and nerve roots (facets). °· The strong, fibrous tissues that connect your vertebrae (ligaments). °Degeneration of these structures may result in pressure on your nerves. This can lead to constant pain. °HOME CARE INSTRUCTIONS °· Avoid bending, heavy lifting, prolonged sitting, and activities which make the problem worse. °· Take brief periods of rest throughout the day to reduce your pain. Lying down or standing usually is better than sitting while you are resting. °· Take over-the-counter or prescription medicines only as directed by your caregiver. °SEEK IMMEDIATE MEDICAL CARE IF:  °· You have weakness or numbness in one of your legs or feet. °· You have trouble controlling your bladder or bowels. °· You have nausea, vomiting, abdominal pain, shortness of breath, or fainting. °  °This information is not intended to replace advice given to you by your health care provider. Make sure you discuss any questions you have with your health care provider. °  °Document Released: 01/16/2005 Document Revised: 03/02/2012 Document Reviewed: 05/29/2015 °Elsevier Interactive Patient Education ©2016 [employer] ° °Heat Therapy °Heat therapy can help ease sore, stiff, injured, and tight muscles and joints. Heat relaxes your muscles, which may help ease your pain.  °RISKS AND COMPLICATIONS °If you have any of the following conditions, do not use heat therapy unless your health care provider has approved: °· Poor circulation. °· Healing wounds or scarred skin in the area being treated. °· Diabetes, heart disease, or high  blood pressure. °· Not being able to feel (numbness) the area being treated. °· Unusual swelling of the area being treated. °· Active infections. °· Blood clots. °· Cancer. °· Inability to communicate pain. This may include young children and people who have problems with their brain function (dementia). °· Pregnancy. °Heat therapy should only be used on old, pre-existing, or long-lasting (chronic) injuries. Do not use heat therapy on new injuries unless directed by your health care provider. °HOW TO USE HEAT THERAPY °There are several different kinds of heat therapy, including: °· Moist heat pack. °· Warm water bath. °· Hot water bottle. °· Electric heating pad. °· Heated gel pack. °· Heated wrap. °· Electric heating pad. °Use the heat therapy method suggested by your health care provider. Follow your health care provider's instructions on when and how to use heat therapy. °GENERAL HEAT THERAPY RECOMMENDATIONS °· Do not sleep while using heat therapy. Only use heat therapy while you are awake. °· Your skin may turn pink while using heat therapy. Do not use heat therapy if your skin turns red. °· Do not use heat therapy if you have new pain. °· High heat or long exposure to heat can cause burns. Be careful when using heat therapy to avoid burning your skin. °· Do not use heat therapy on areas of your skin that are already irritated, such as with a rash or sunburn. °SEEK MEDICAL CARE IF: °· You have blisters, redness, swelling, or numbness. °· You have new pain. °· Your pain is worse. °MAKE SURE YOU: °· Understand these instructions. °· Will watch your condition. °· Will get help right away if you are not doing well   or get worse.   This information is not intended to replace advice given to you by your health care provider. Make sure you discuss any questions you have with your health care provider.   Document Released: 03/02/2012 Document Revised: 12/30/2014 Document Reviewed: 02/01/2014 Elsevier Interactive  Patient Education 2016 [employer]  Muscle Cramps and Spasms Muscle cramps and spasms occur when a muscle or muscles tighten and you have no control over this tightening (involuntary muscle contraction). They are a common problem and can develop in any muscle. The most common place is in the calf muscles of the leg. Both muscle cramps and muscle spasms are involuntary muscle contractions, but they also have differences:   Muscle cramps are sporadic and painful. They may last a few seconds to a quarter of an hour. Muscle cramps are often more forceful and last longer than muscle spasms.  Muscle spasms may or may not be painful. They may also last just a few seconds or much longer. CAUSES  It is uncommon for cramps or spasms to be due to a serious underlying problem. In many cases, the cause of cramps or spasms is unknown. Some common causes are:   Overexertion.   Overuse from repetitive motions (doing the same thing over and over).   Remaining in a certain position for a long period of time.   Improper preparation, form, or technique while performing a sport or activity.   Dehydration.   Injury.   Side effects of some medicines.   Abnormally low levels of the salts and ions in your blood (electrolytes), especially potassium and calcium. This could happen if you are taking water pills (diuretics) or you are pregnant.  Some underlying medical problems can make it more likely to develop cramps or spasms. These include, but are not limited to:   Diabetes.   Parkinson disease.   Hormone disorders, such as thyroid problems.   Alcohol abuse.   Diseases specific to muscles, joints, and bones.   Blood vessel disease where not enough blood is getting to the muscles.  HOME CARE INSTRUCTIONS   Stay well hydrated. Drink enough water and fluids to keep your urine clear or pale yellow.  It may be helpful to massage, stretch, and relax the affected muscle.  For tight or  tense muscles, use a warm towel, heating pad, or hot shower water directed to the affected area.  If you are sore or have pain after a cramp or spasm, applying ice to the affected area may relieve discomfort.  Put ice in a plastic bag.  Place a towel between your skin and the bag.  Leave the ice on for 15-20 minutes, 03-04 times a day.  Medicines used to treat a known cause of cramps or spasms may help reduce their frequency or severity. Only take over-the-counter or prescription medicines as directed by your caregiver. SEEK MEDICAL CARE IF:  Your cramps or spasms get more severe, more frequent, or do not improve over time.  MAKE SURE YOU:   Understand these instructions.  Will watch your condition.  Will get help right away if you are not doing well or get worse.   This information is not intended to replace advice given to you by your health care provider. Make sure you discuss any questions you have with your health care provider.   Document Released: 05/31/2002 Document Revised: 04/05/2013 Document Reviewed: 11/25/2012 Elsevier Interactive Patient Education [employer].

## 2016-04-19 NOTE — ED Provider Notes (Signed)
[facility] Emergency Department Provider Note  ____________________________________________  Time seen: Approximately 8:37 PM  I have reviewed the triage vital signs and the nursing notes.   HISTORY  Chief Complaint Back Pain    HPI Sarah Steele is a 35 y.o. female who presents emergency department complaining of lower back pain. Patient states that she has chronic low back pain from degenerative disc disease. Patient states that she was lifting some heavy items at work when she felt a pulling sensation in her lower back. Patient states over the last 2 days symptoms have increased. She has been trying to take muscle relaxers that are prescribed for her however, she states that she has run out of same. Patient is continue to work but states that the motion has increased the pain. Patient states that she has chronic sciatica and there is no change from baseline. She denies any bowel or bladder dysfunction, saddle anesthesia, paresthesias. Patient has used ice to the area with an increase of symptoms.   Past Medical History  Diagnosis Date  . Back pain   . Arthritis   . DDD (degenerative disc disease)   . Heart murmur     There are no active problems to display for this patient.   History reviewed. No pertinent past surgical history.  Current Outpatient Rx  Name  Route  Sig  Dispense  Refill  . ALPRAZolam (XANAX) 0.25 MG tablet   Oral   Take 0.25 mg by mouth 3 (three) times daily as needed for anxiety.         . butalbital-acetaminophen-caffeine (FIORICET) 50-325-40 MG per tablet   Oral   Take 1-2 tablets by mouth every 6 (six) hours as needed for headache.   20 tablet   0   . ciprofloxacin (CIPRO) 500 MG tablet   Oral   Take 1 tablet (500 mg total) by mouth 2 (two) times daily. One po bid x 7 days   14 tablet   0   . EPINEPHrine (EPIPEN JR) 0.15 MG/0.3ML injection   Intramuscular   Inject 0.15 mg into the muscle as needed for anaphylaxis.          Marland Kitchen HYDROcodone-acetaminophen (NORCO/VICODIN) 5-325 MG per tablet   Oral   Take 2 tablets by mouth every 6 (six) hours as needed for pain.   20 tablet   0   . HYDROcodone-acetaminophen (NORCO/VICODIN) 5-325 MG per tablet   Oral   Take 1 tablet by mouth every 6 (six) hours as needed for pain.   15 tablet   0   . ibuprofen (ADVIL,MOTRIN) 200 MG tablet   Oral   Take 800 mg by mouth every 6 (six) hours as needed for pain.          Marland Kitchen loratadine (CLARITIN) 10 MG tablet   Oral   Take 10 mg by mouth daily.         . meloxicam (MOBIC) 15 MG tablet   Oral   Take 1 tablet (15 mg total) by mouth daily.   30 tablet   0   . methocarbamol (ROBAXIN) 500 MG tablet   Oral   Take 1 tablet (500 mg total) by mouth 2 (two) times daily.   20 tablet   0   . tiZANidine (ZANAFLEX) 4 MG capsule   Oral   Take 1 capsule (4 mg total) by mouth 3 (three) times daily as needed for muscle spasms.   15 capsule   0  Allergies Aleve cold &; Amoxicillin; Chloraseptic sore throat; Clindamycin/lincomycin; Doxycycline; Flagyl; Influenza vaccines; Neurontin; Nubain; Penicillins; Phenergan; Prednisone; Septra; and Ultram  No family history on file.  Social History Social History  Substance Use Topics  . Smoking status: Current Every Day Smoker -- 0.25 packs/day    Types: Cigarettes  . Smokeless tobacco: None  . Alcohol Use: Yes     Comment: once every 6 months     Review of Systems  Constitutional: No fever/chills Eyes: No visual changes.  Cardiovascular: no chest pain. Respiratory: no cough. No SOB. Gastrointestinal: No abdominal pain.  No nausea, no vomiting.   Genitourinary: Negative for dysuria. No hematuria Musculoskeletal: Positive for lower back pain. Skin: Negative for rash, abrasions, lacerations, ecchymosis. Neurological: Negative for headaches, focal weakness or numbness. 10-point ROS otherwise negative.  ____________________________________________   PHYSICAL  EXAM:  VITAL SIGNS: ED Triage Vitals  Enc Vitals Group     BP 04/19/16 2035 143/80 mmHg     Pulse Rate 04/19/16 2035 79     Resp 04/19/16 2035 16     Temp 04/19/16 2035 98.4 F (36.9 C)     Temp Source 04/19/16 2035 Oral     SpO2 04/19/16 2035 100 %     Weight 04/19/16 2035 252 lb (114.306 kg)     Height 04/19/16 2035 5\' 9"  (1.753 m)     Head Cir --      Peak Flow --      Pain Score --      Pain Loc --      Pain Edu? --      Excl. in [place]? --      Constitutional: Alert and oriented. Well appearing and in no acute distress. Eyes: Conjunctivae are normal. PERRL. EOMI. Head: Atraumatic. ENT:      Ears:       Nose: No congestion/rhinnorhea.      Mouth/Throat: Mucous membranes are moist.  Neck: No stridor.    Cardiovascular: Normal rate, regular rhythm. Normal S1 and S2.  Good peripheral circulation. Respiratory: Normal respiratory effort without tachypnea or retractions. Lungs CTAB. Good air entry to the bases with no decreased or absent breath sounds. Musculoskeletal: Full range of motion to all extremities. No gross deformities appreciated. No visible foreign despite upon inspection. Patient is diffusely tender to palpation over the lumbar spine and left sided paraspinal muscle group. No point tenderness. Patient is tender to palpation over bilateral sciatic notch. Positive straight leg raise bilaterally. Dorsalis pedis pulses appreciated bilaterally. Sensation intact and equal lower extremities. Muscle spasms are appreciated with palpation over the left-sided lumbar spinal group. Neurologic:  Normal speech and language. No gross focal neurologic deficits are appreciated.  Skin:  Skin is warm, dry and intact. No rash noted. Psychiatric: Mood and affect are normal. Speech and behavior are normal. Patient exhibits appropriate insight and judgement.   ____________________________________________   LABS (all labs ordered are listed, but only abnormal results are displayed)  Labs  Reviewed - No data to display ____________________________________________  EKG   ____________________________________________  RADIOLOGY   No results found.  ____________________________________________    PROCEDURES  Procedure(s) performed:       Medications - No data to display   ____________________________________________   INITIAL IMPRESSION / ASSESSMENT AND PLAN / ED COURSE  Pertinent labs & imaging results that were available during my care of the patient were reviewed by me and considered in my medical decision making (see chart for details).  Patient's diagnosis is consistent with chronic  lumbago with sciatica exacerbated by lumbar paraspinal muscle spasms. Exam is reassuring. Patient will be discharged home with prescriptions for anti-inflammatories and muscle relaxers. Patient I receives monthly prescribed pain medication from primary care provider. She may take prescribed pain medication in addition to these medications for symptom relief. Patient will follow-up with primary care provider for any further complaints.. Patient is given ED precautions to return to the ED for any worsening or new symptoms.     ____________________________________________  FINAL CLINICAL IMPRESSION(S) / ED DIAGNOSES  Final diagnoses:  Chronic midline low back pain with bilateral sciatica  Lumbar paraspinal muscle spasm      NEW MEDICATIONS STARTED DURING THIS VISIT:  New Prescriptions   MELOXICAM (MOBIC) 15 MG TABLET    Take 1 tablet (15 mg total) by mouth daily.   TIZANIDINE (ZANAFLEX) 4 MG CAPSULE    Take 1 capsule (4 mg total) by mouth 3 (three) times daily as needed for muscle spasms.        This chart was dictated using voice recognition software/Dragon. Despite best efforts to proofread, errors can occur which can change the meaning. Any change was purely unintentional.    Darletta Moll, PA-C 04/19/16 2056  Nena Polio, MD 04/20/16 [id number]

## 2016-04-19 NOTE — ED Notes (Addendum)
Pt c/o chronic back pain that she aggrivated 2 days ago while at work.  Pt denies incontinence, cp, sob, N/V/D.  Pt sts she has had no relief w/ regular medications.  NAD.

## 2016-04-19 NOTE — ED Notes (Signed)
Patient states that she takes pain pills and muscle relaxers to help with her pain.  None of them are working for her.  States she last had them this morning.  Also states she has used ice and heat to help the pain.  Those also did not work.

## 2017-01-05 ENCOUNTER — Encounter: Payer: Self-pay | Admitting: Emergency Medicine

## 2017-01-05 ENCOUNTER — Emergency Department
Admission: EM | Admit: 2017-01-05 | Discharge: 2017-01-05 | Disposition: A | Discharge status: Home / Self Care | Payer: 59 | Attending: Emergency Medicine | Admitting: Emergency Medicine

## 2017-01-05 ENCOUNTER — Emergency Department: Payer: 59

## 2017-01-05 DIAGNOSIS — S9031XA Contusion of right foot, initial encounter: Secondary | ICD-10-CM | POA: Diagnosis not present

## 2017-01-05 DIAGNOSIS — W208XXA Other cause of strike by thrown, projected or falling object, initial encounter: Secondary | ICD-10-CM | POA: Diagnosis not present

## 2017-01-05 DIAGNOSIS — Y999 Unspecified external cause status: Secondary | ICD-10-CM | POA: Insufficient documentation

## 2017-01-05 DIAGNOSIS — S99921A Unspecified injury of right foot, initial encounter: Secondary | ICD-10-CM | POA: Diagnosis present

## 2017-01-05 DIAGNOSIS — F17210 Nicotine dependence, cigarettes, uncomplicated: Secondary | ICD-10-CM | POA: Diagnosis not present

## 2017-01-05 DIAGNOSIS — Y929 Unspecified place or not applicable: Secondary | ICD-10-CM | POA: Insufficient documentation

## 2017-01-05 DIAGNOSIS — Y939 Activity, unspecified: Secondary | ICD-10-CM | POA: Diagnosis not present

## 2017-01-05 MED ORDER — MELOXICAM 15 MG PO TABS: 15.0000 mg | ORAL_TABLET | Freq: Every day | ORAL | 0 refills | Status: DC

## 2017-01-05 NOTE — ED Provider Notes (Signed)
[facility] Emergency Department Provider Note  ____________________________________________  Time seen: Approximately 3:34 PM  I have reviewed the triage vital signs and the nursing notes.   HISTORY  Chief Complaint Foot Pain    HPI Sarah Steele is a 36 y.o. female, NAD, presents to the emergency department for 1 week history of right foot pain and swelling. Patient states a can of vegetables fell on her right foot approximately one week ago. Had immediate pain and swelling about the area due to the injury. Pain is located on the top of the foot and extends to the great toe and is sharp. Pain worsens with long-term standing and walking. Patient reports swelling about the top of the right foot but has not noted any bruising, redness, abnormal warmth or open wounds. Patient denies any numbness or tingling in right lower extremity but has a history of sciatica which is well-controlled with chronic pain medications. Has taken ibuprofen and Percocet with minimal relief. No history of diabetes or peripheral neuropathy.    Past Medical History:  Diagnosis Date  . Arthritis   . Back pain   . DDD (degenerative disc disease)   . Heart murmur     There are no active problems to display for this patient.   History reviewed. No pertinent surgical history.  Prior to Admission medications   Medication Sig Start Date End Date Taking? Authorizing Provider  ALPRAZolam (XANAX) 0.25 MG tablet Take 0.25 mg by mouth 3 (three) times daily as needed for anxiety.    Historical Provider, MD  EPINEPHrine (EPIPEN JR) 0.15 MG/0.3ML injection Inject 0.15 mg into the muscle as needed for anaphylaxis.    Historical Provider, MD  ibuprofen (ADVIL,MOTRIN) 200 MG tablet Take 800 mg by mouth every 6 (six) hours as needed for pain.     Historical Provider, MD  loratadine (CLARITIN) 10 MG tablet Take 10 mg by mouth daily.    Historical Provider, MD  meloxicam (MOBIC) 15 MG tablet Take 1 tablet  (15 mg total) by mouth daily. 01/05/17   Brenlee Koskela L Aydien Majette, PA-C    Allergies Aleve cold & [pseudoephedrine-naproxen na er]; Amoxicillin; Chloraseptic sore throat [acetaminophen]; Clindamycin/lincomycin; Doxycycline; Flagyl [metronidazole hcl]; Influenza vaccines; Neurontin [gabapentin]; Nubain [nalbuphine hcl]; Penicillins; Phenergan [promethazine hcl]; Prednisone; Septra [bactrim]; and Ultram [tramadol hcl]  No family history on file.  Social History Social History  Substance Use Topics  . Smoking status: Current Every Day Smoker    Packs/day: 0.25    Types: Cigarettes  . Smokeless tobacco: Never Used  . Alcohol use Yes     Comment: once every 6 months     Review of Systems Constitutional: No fatigue Musculoskeletal: Positive right foot pain and right great toe pain. No right lower leg pain.    Skin: Positive swelling right foot. Negative for rash or redness, abnormal warmth, skin sores, lacerations. Neurological: Negative for Numbness, weakness, tingling.  ____________________________________________   PHYSICAL EXAM:  VITAL SIGNS: ED Triage Vitals  Enc Vitals Group     BP 01/05/17 1446 (!) 145/86     Pulse Rate 01/05/17 1446 73     Resp 01/05/17 1446 18     Temp 01/05/17 1446 98.2 F (36.8 C)     Temp Source 01/05/17 1446 Oral     SpO2 01/05/17 1446 98 %     Weight 01/05/17 1447 263 lb (119.3 kg)     Height 01/05/17 1447 5\' 9"  (1.753 m)     Head Circumference --  Peak Flow --      Pain Score --      Pain Loc --      Pain Edu? --      Excl. in [place]? --      Constitutional: Alert and oriented. Well appearing and in no acute distress. Eyes: Conjunctivae are normal. Head: Atraumatic. Cardiovascular: Good peripheral circulation With 2+ pulses in the right lower extremity. Capillary refill is brisk in all digits of right foot.  Respiratory: Normal respiratory effort without tachypnea or retractions.  Musculoskeletal: Tenderness to palpation noted to dorsal aspect of  right foot and right great toe without crepitus or bony abnormalities. No tenderness to palpation of the right ankle. No lower extremity tenderness or edema. Full range of motion of bilateral ankles without pain or difficulty. Strength of bilateral feet and ankles 5 out of 5 and equal.  Neurologic:  Normal speech and language. No gross focal neurologic deficits are appreciated. Sensation to light touch grossly intact by the right lower extremity. Skin:  Skin is warm, dry and intact. No rash noted. No contusion or hematoma noted to right foot but mild swelling is noted about the dorsal distal aspect. Psychiatric: Mood and affect are normal. Speech and behavior are normal. Patient exhibits appropriate insight and judgement.   ____________________________________________   LABS  None ____________________________________________  EKG  None ____________________________________________  RADIOLOGY I, Braxton Feathers, personally viewed and evaluated these images (plain radiographs) as part of my medical decision making, as well as reviewing the written report by the radiologist.  Dg Foot Complete Right  Result Date: 01/05/2017 CLINICAL DATA:  Pt states she dropped a can a vegetables on her right foot 1 week ago. Pain and mild swelling over dorsal and plantar surface of the 1st and 2nd metatarsals. No prior injuries or surgeries. Pt states she does often get ingrown toe nails on her great toe. EXAM: RIGHT FOOT COMPLETE - 3+ VIEW COMPARISON:  None. FINDINGS: No fracture.  No bone lesion. The joints are normally spaced and aligned.  No arthropathic change. Small plantar calcaneal spur. Mild forefoot subcutaneous soft tissue edema. IMPRESSION: No fracture or dislocation. Electronically Signed   By: Lajean Manes M.D.   On: 01/05/2017 15:59    ____________________________________________    PROCEDURES  Procedure(s) performed: None   Procedures   Medications - No data to  display   ____________________________________________   INITIAL IMPRESSION / ASSESSMENT AND PLAN / ED COURSE  Pertinent labs & imaging results that were available during my care of the patient were reviewed by me and considered in my medical decision making (see chart for details).  Clinical Course     Patient's diagnosis is consistent with Contusion of right foot. Patient was placed in a flat bottom postop shoe for comfort care. Patient will be discharged home with prescriptions for meloxicam to take as directed. Patient is to follow up with Dr. Cleda Mccreedy in podiatry if symptoms persist past this treatment course. Patient is given ED precautions to return to the ED for any worsening or new symptoms.    ____________________________________________  FINAL CLINICAL IMPRESSION(S) / ED DIAGNOSES  Final diagnoses:  Contusion of right foot, initial encounter      NEW MEDICATIONS STARTED DURING THIS VISIT:  Discharge Medication List as of 01/05/2017  4:24 PM           Braxton Feathers, PA-C 01/05/17 1654    Earleen Newport, MD 01/05/17 Vernelle Emerald

## 2017-01-05 NOTE — ED Triage Notes (Signed)
Having pain to right foot and great toe for about 1 week

## 2017-02-02 IMAGING — CR DG CERVICAL SPINE COMPLETE 4+V
5 series · 5 of 5 positions shown · non-contrast
Comparison: Cervical spine films of 05/04/2005

CLINICAL DATA: Lateral and posterior neck pain radiating to the
right scapula for several weeks, no injury

EXAM:
CERVICAL SPINE  4+ VIEWS

[w c-spine lat]
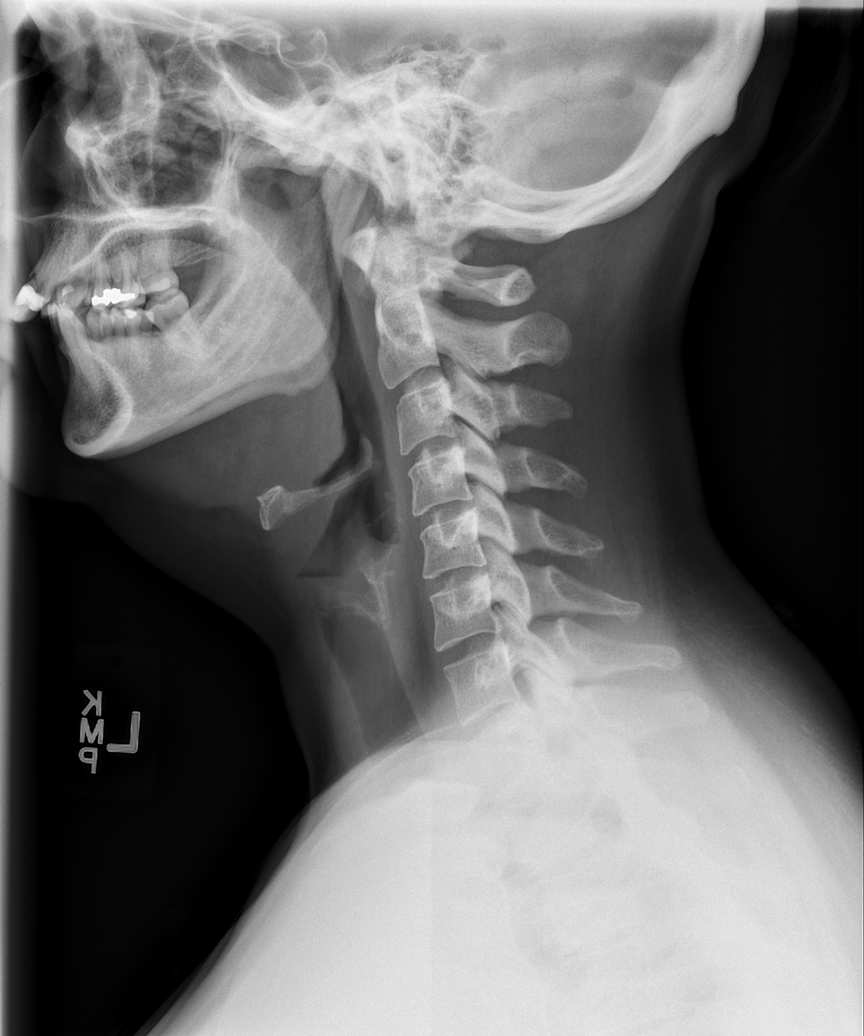

[w c-spine oblique (1 of 2)]
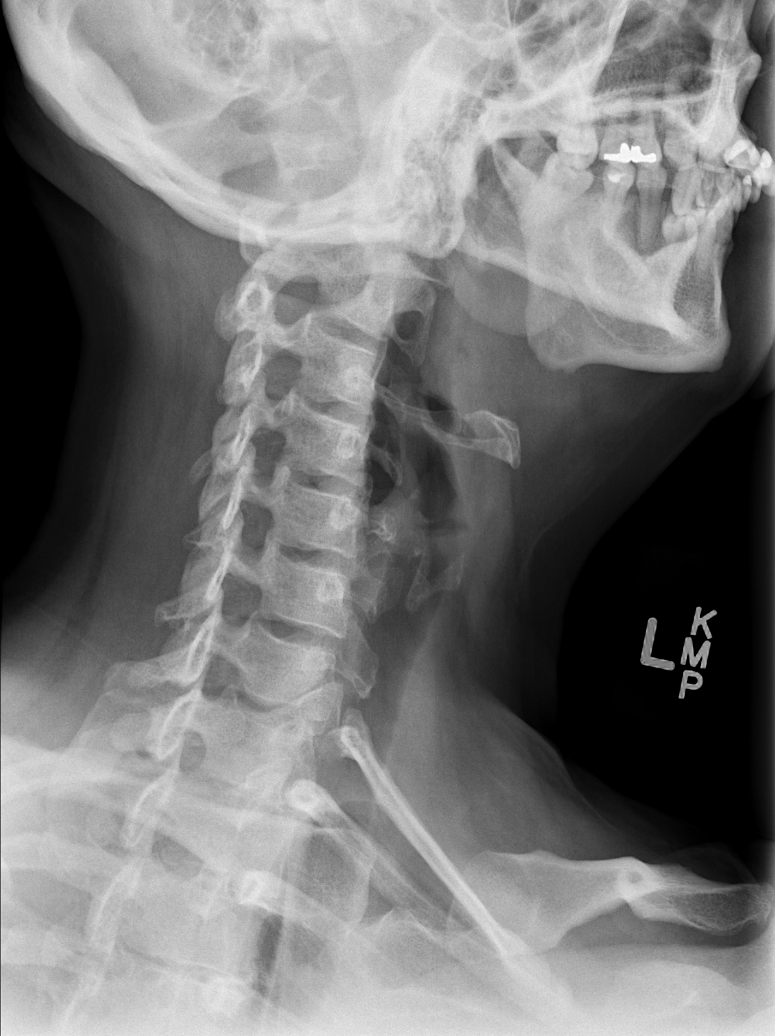

[w c-spine oblique (2 of 2)]
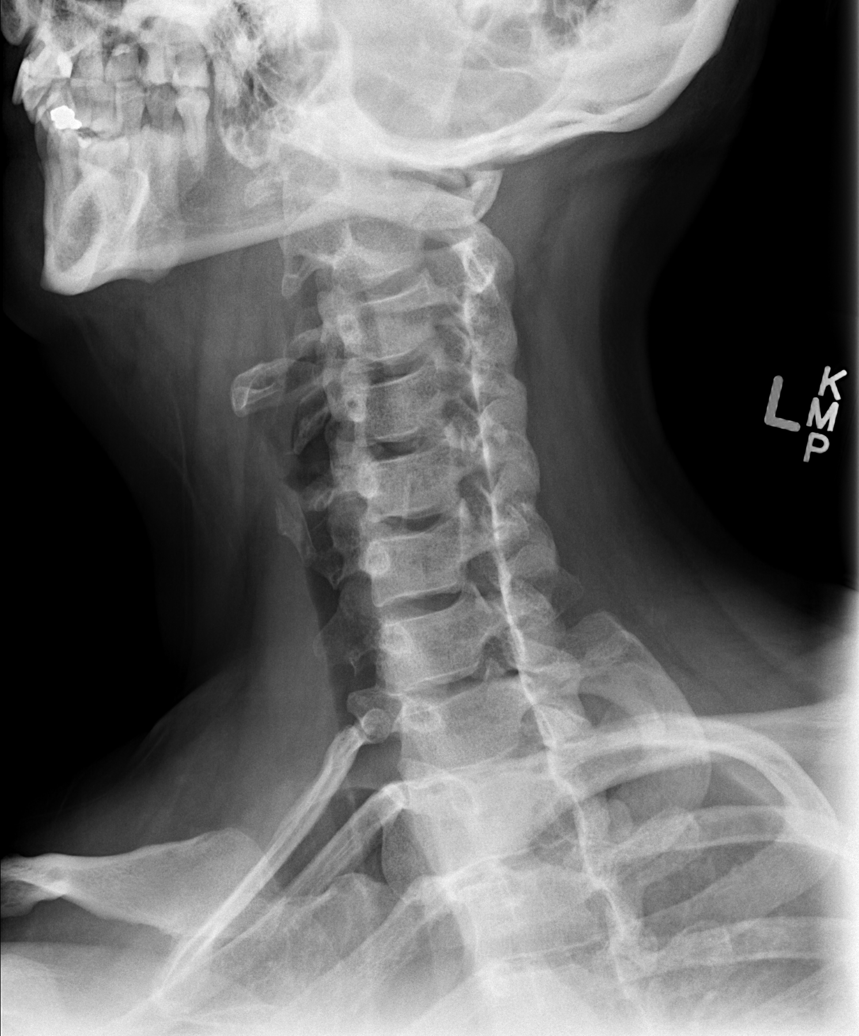

[w c-spine a.p. *]
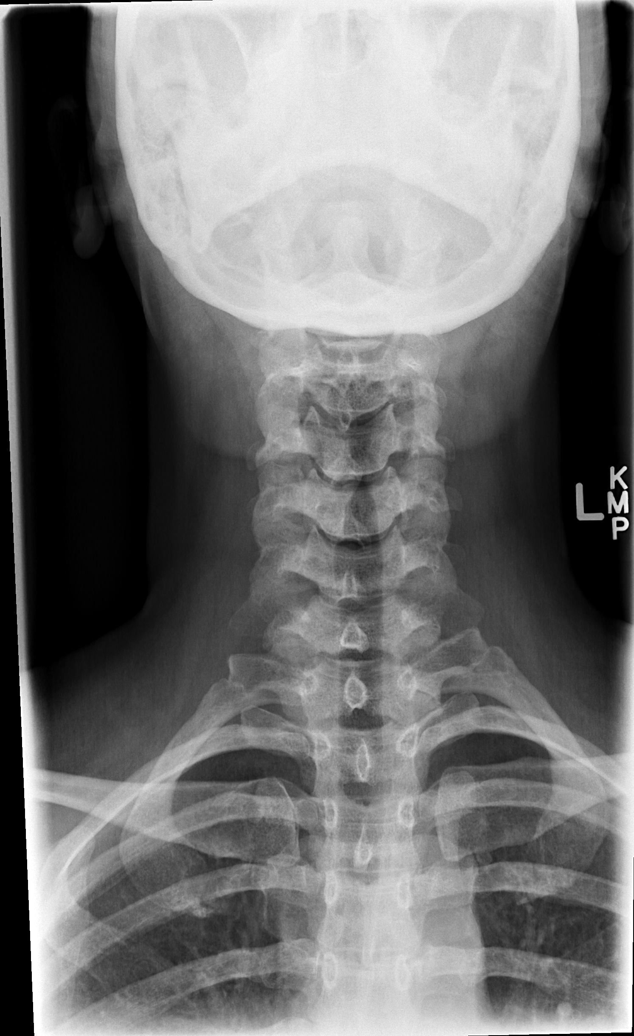

[w c-spine odontoid *]
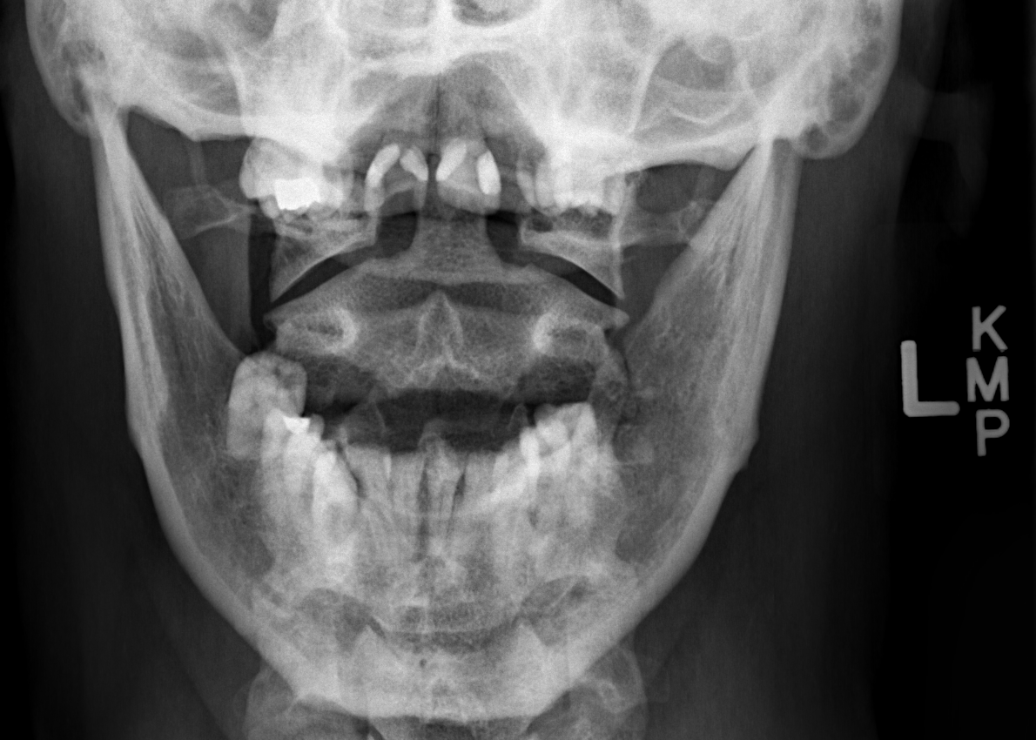

[5 of 5 positions shown; findings below may reference images not displayed]

FINDINGS: The cervical vertebrae are straightened in alignment. Intervertebral
disc spaces appear normal. No prevertebral soft tissue swelling is
seen. On oblique views, the foramina appear widely patent. The
odontoid process is intact.
IMPRESSION: Straightened alignment.  Normal intervertebral disc spaces.

## 2017-03-17 ENCOUNTER — Other Ambulatory Visit: Payer: Self-pay | Admitting: Cardiovascular Disease

## 2017-03-17 ENCOUNTER — Ambulatory Visit
Admission: RE | Admit: 2017-03-17 | Discharge: 2017-03-17 | Disposition: A | Discharge status: Home / Self Care | Payer: 59 | Source: Ambulatory Visit | Attending: Cardiovascular Disease | Admitting: Cardiovascular Disease

## 2017-03-17 DIAGNOSIS — M549 Dorsalgia, unspecified: Secondary | ICD-10-CM

## 2017-03-17 DIAGNOSIS — M79606 Pain in leg, unspecified: Secondary | ICD-10-CM

## 2017-03-27 ENCOUNTER — Other Ambulatory Visit: Payer: Self-pay | Admitting: Cardiovascular Disease

## 2017-03-27 DIAGNOSIS — M5117 Intervertebral disc disorders with radiculopathy, lumbosacral region: Secondary | ICD-10-CM

## 2017-04-08 ENCOUNTER — Ambulatory Visit
Admission: RE | Admit: 2017-04-08 | Discharge: 2017-04-08 | Disposition: A | Discharge status: Home / Self Care | Payer: 59 | Source: Ambulatory Visit | Attending: Cardiovascular Disease | Admitting: Cardiovascular Disease

## 2017-04-08 DIAGNOSIS — M5117 Intervertebral disc disorders with radiculopathy, lumbosacral region: Secondary | ICD-10-CM

## 2018-01-28 ENCOUNTER — Other Ambulatory Visit: Payer: Self-pay

## 2018-01-28 ENCOUNTER — Emergency Department
Admission: EM | Admit: 2018-01-28 | Discharge: 2018-01-28 | Disposition: A | Discharge status: Home / Self Care | Payer: 59 | Attending: Emergency Medicine | Admitting: Emergency Medicine

## 2018-01-28 DIAGNOSIS — T162XXA Foreign body in left ear, initial encounter: Secondary | ICD-10-CM | POA: Diagnosis not present

## 2018-01-28 DIAGNOSIS — H9202 Otalgia, left ear: Secondary | ICD-10-CM | POA: Diagnosis present

## 2018-01-28 DIAGNOSIS — Y998 Other external cause status: Secondary | ICD-10-CM | POA: Insufficient documentation

## 2018-01-28 DIAGNOSIS — Y939 Activity, unspecified: Secondary | ICD-10-CM | POA: Diagnosis not present

## 2018-01-28 DIAGNOSIS — Y33XXXA Other specified events, undetermined intent, initial encounter: Secondary | ICD-10-CM | POA: Diagnosis not present

## 2018-01-28 DIAGNOSIS — Y929 Unspecified place or not applicable: Secondary | ICD-10-CM | POA: Insufficient documentation

## 2018-01-28 DIAGNOSIS — F17210 Nicotine dependence, cigarettes, uncomplicated: Secondary | ICD-10-CM | POA: Insufficient documentation

## 2018-01-28 MED ORDER — CIPROFLOXACIN-DEXAMETHASONE 0.3-0.1 % OT SUSP: 4.0000 [drp] | Freq: Two times a day (BID) | OTIC | 0 refills | Status: DC

## 2018-01-28 NOTE — ED Triage Notes (Signed)
Pt In with co left sided earache since Saturday, states may have part of qtip in ear.

## 2018-01-28 NOTE — ED Provider Notes (Signed)
[facility] Emergency Department Provider Note   First MD Initiated Contact with Patient 01/28/18 [phone]     (approximate)  I have reviewed the triage vital signs and the nursing notes.   HISTORY  Chief Complaint Otalgia    HPI Sarah Steele is a 37 y.o. female presents to the emergency department with left earache and decreased hearing from the left ear times 4 days.  Patient believes that they might be a Q-tip in the left ear she states that she was cleaning her ear and remove that the cotton portion of the Q-tip was missing.  She states that she has attempted to remove it without any success since that time.  Patient denies any drainage from the left ear.  Patient denies any pain at present   Past Medical History:  Diagnosis Date  . Arthritis   . Back pain   . DDD (degenerative disc disease)   . Heart murmur     There are no active problems to display for this patient.   No past surgical history on file.  Prior to Admission medications   Medication Sig Start Date End Date Taking? Authorizing Provider  ALPRAZolam (XANAX) 0.25 MG tablet Take 0.25 mg by mouth 3 (three) times daily as needed for anxiety.    [provider]  EPINEPHrine (EPIPEN JR) 0.15 MG/0.3ML injection Inject 0.15 mg into the muscle as needed for anaphylaxis.    [provider]  ibuprofen (ADVIL,MOTRIN) 200 MG tablet Take 800 mg by mouth every 6 (six) hours as needed for pain.     [provider]  loratadine (CLARITIN) 10 MG tablet Take 10 mg by mouth daily.    [provider]  meloxicam (MOBIC) 15 MG tablet Take 1 tablet (15 mg total) by mouth daily. 01/05/17   Hagler, Jami L, PA-C    Allergies Aleve cold & [pseudoephedrine-naproxen na er]; Amoxicillin; Chloraseptic sore throat [acetaminophen]; Clindamycin/lincomycin; Doxycycline; Flagyl [metronidazole hcl]; Influenza vaccines; Neurontin [gabapentin]; Nubain [nalbuphine hcl]; Penicillins; Phenergan  [promethazine hcl]; Prednisone; Septra [bactrim]; and Ultram [tramadol hcl]  No family history on file.  Social History Social History   Tobacco Use  . Smoking status: Current Every Day Smoker    Packs/day: 0.25    Types: Cigarettes  . Smokeless tobacco: Never Used  Substance Use Topics  . Alcohol use: Yes    Comment: once every 6 months  . Drug use: No    Review of Systems Constitutional: No fever/chills Eyes: No visual changes. ENT: No sore throat.  Positive for left earache and decreased hearing from the left ear Cardiovascular: Denies chest pain. Respiratory: Denies shortness of breath. Gastrointestinal: No abdominal pain.  No nausea, no vomiting.  No diarrhea.  No constipation. Genitourinary: Negative for dysuria. Musculoskeletal: Negative for neck pain.  Negative for back pain. Integumentary: Negative for rash. Neurological: Negative for headaches, focal weakness or numbness.   ____________________________________________   PHYSICAL EXAM:  VITAL SIGNS: ED Triage Vitals  Enc Vitals Group     BP 01/28/18 0612 (!) 157/82     Pulse Rate 01/28/18 0612 82     Resp 01/28/18 0612 18     Temp 01/28/18 0612 97.8 F (36.6 C)     Temp Source 01/28/18 0612 Oral     SpO2 01/28/18 0612 98 %     Weight 01/28/18 0609 122.5 kg (270 lb)     Height 01/28/18 0609 1.753 m (5\' 9" )     Head Circumference --  Peak Flow --      Pain Score 01/28/18 0609 8     Pain Loc --      Pain Edu? --      Excl. in [place]? --     Constitutional: Alert and oriented. Well appearing and in no acute distress. Eyes: Conjunctivae are normal.  Head: Atraumatic. Ears: White foreign body noted in the left external auditory canal completely obscuring the canal Nose: No congestion/rhinnorhea. Mouth/Throat: Mucous membranes are moist. Oropharynx non-erythematous. Neck: No stridor.   Neurologic:  Normal speech and language. No gross focal neurologic deficits are appreciated.  Skin:  Skin is warm,  dry and intact. No rash noted. Psychiatric: Mood and affect are normal. Speech and behavior are normal.    .Foreign Body Removal Date/Time: 01/29/2018 7:24 AM Performed by: Gregor Hams, MD Authorized by: Gregor Hams, MD  Consent: Verbal consent obtained. Consent given by: patient Patient understanding: patient states understanding of the procedure being performed Patient identity confirmed: verbally with patient and arm band Body area: ear Location details: left ear  Sedation: Patient sedated: no  Patient restrained: no Patient cooperative: yes Removal mechanism: alligator forceps 1 objects recovered. Objects recovered: Q-tip Post-procedure assessment: foreign body removed Patient tolerance: Patient tolerated the procedure well with no immediate complications     ____________________________________________   INITIAL IMPRESSION / ASSESSMENT AND PLAN / ED COURSE  As part of my medical decision making, I reviewed the following data within the electronic MEDICAL RECORD [id number] year old female presenting with left ear discomfort and decreased hearing with suspected foreign body.  Foreign body removed from the left ear without any difficulty.  Following removal TM without any abnormality external auditory canal minor abrasions no active signs of infection. ____________________________________________  FINAL CLINICAL IMPRESSION(S) / ED DIAGNOSES  Final diagnoses:  Ear foreign body, left, initial encounter     MEDICATIONS GIVEN DURING THIS VISIT:  Medications - No data to display   ED Discharge Orders    None       Note:  This document was prepared using Dragon voice recognition software and may include unintentional dictation errors.    Gregor Hams, MD 01/29/18 [phone]

## 2018-01-28 NOTE — ED Notes (Signed)
Pt reports "having fluid in left ear and used a cheap qtip and when I took it out some of the cotton was off the qtip." Pt states she used ear wax remover and tried to flush her eat out however states "I still feel like there is something in there and my ear hurts." Pt states her friend also tried to use tweezers to remove the cotton from ear Saturday. Pt denies drainage however states pain to left ear. Pt reports nasal drainage, denies fever.

## 2018-04-21 ENCOUNTER — Emergency Department
Admission: EM | Admit: 2018-04-21 | Discharge: 2018-04-21 | Disposition: A | Discharge status: Home / Self Care | Payer: 59 | Attending: Emergency Medicine | Admitting: Emergency Medicine

## 2018-04-21 ENCOUNTER — Other Ambulatory Visit: Payer: Self-pay

## 2018-04-21 DIAGNOSIS — K029 Dental caries, unspecified: Secondary | ICD-10-CM | POA: Diagnosis not present

## 2018-04-21 DIAGNOSIS — F17210 Nicotine dependence, cigarettes, uncomplicated: Secondary | ICD-10-CM | POA: Diagnosis not present

## 2018-04-21 DIAGNOSIS — K047 Periapical abscess without sinus: Secondary | ICD-10-CM | POA: Diagnosis not present

## 2018-04-21 DIAGNOSIS — K0889 Other specified disorders of teeth and supporting structures: Secondary | ICD-10-CM | POA: Diagnosis present

## 2018-04-21 DIAGNOSIS — Z79899 Other long term (current) drug therapy: Secondary | ICD-10-CM | POA: Diagnosis not present

## 2018-04-21 MED ORDER — CIPROFLOXACIN HCL 250 MG PO TABS: 250.0000 mg | ORAL_TABLET | Freq: Two times a day (BID) | ORAL | 0 refills | Status: DC

## 2018-04-21 NOTE — Discharge Instructions (Addendum)
OPTIONS FOR DENTAL FOLLOW UP CARE ° °[place] Department of Health and Human Services - Local Safety Net Dental Clinics °http://www.ncdhhs.gov/dph/oralhealth/services/safetynetclinics.htm °  °[facility] (336-562-3123) ° °[facility] (919-933-9087) ° °[facility] (919-663-1744 ext [phone]) ° °[facility] ([phone]) ° °[facility] ([phone]) °This clinic caters to the indigent population and is on a lottery system. °Location: °[facility], [address], [place] °Clinic Hours: °Wednesdays from 6pm - 9pm, patients seen by a lottery system. °For dates, call or go to [email or web address].[facility].edu/[facility]/patients/Dental-[facility] °Services: °Cleanings, fillings and simple extractions. °Payment Options: °DENTAL WORK IS FREE OF CHARGE. Bring proof of income or support. °Best way to get seen: °Arrive at 5:15 pm - this is a lottery, NOT first come/first serve, so arriving earlier will not increase your chances of being seen. °  °  °[facility] °919-537-3737 °Select option 1 for emergencies °  °Location: °[facility], [address], [place] °Clinic Hours: °No walk-ins accepted - call the day before to schedule an appointment. °Check in times are 9:30 am and 1:30 pm. °Services: °Simple extractions, temporary fillings, pulpectomy/pulp debridement, uncomplicated abscess drainage. °Payment Options: °PAYMENT IS DUE AT THE TIME OF SERVICE.  Fee is usually $100-200, additional surgical procedures (e.g. abscess drainage) may be extra. °Cash, checks, Visa/MasterCard accepted.  Can file Medicaid if patient is covered for dental - patient should call case worker to check. °No discount for [facility] patients. °Best way to get seen: °MUST call the day before and get onto the schedule. Can usually be seen the next 1-2 days. No walk-ins accepted. °  °  °[facility] °[phone] °   °Location: °[facility], [address], [place] °Clinic Hours: °M, W, Th, F 8am or 1:30pm, Tues 9a or 1:30 - first come/first served. °Services: °Simple extractions, temporary fillings, uncomplicated abscess drainage.  You do not need to be an [place] resident. °Payment Options: °PAYMENT IS DUE AT THE TIME OF SERVICE. °Dental insurance, otherwise sliding scale - bring proof of income or support. °Depending on income and treatment needed, cost is usually $50-200. °Best way to get seen: °Arrive early as it is first come/first served. °  °  °[facility] °[phone] °  °Location: °[address] °Clinic Hours: °Mon-Thu 8a-5p °Services: °Most basic dental services including extractions and fillings. °Payment Options: °PAYMENT IS DUE AT THE TIME OF SERVICE. °Sliding scale, up to 50% off - bring proof if income or support. °Medicaid with dental option accepted. °Best way to get seen: °Call to schedule an appointment, can usually be seen within 2 weeks OR they will try to see walk-ins - show up at 8a or 2p (you may have to wait). °  °  °[facility] °919-245-2435 °[place] RESIDENTS ONLY °  °Location: °[facility], [address], [place], [place] [address] °Clinic Hours: By appointment only. °Monday - Thursday 8am-5pm, Friday 8am-12pm °Services: Cleanings, fillings, extractions. °Payment Options: °PAYMENT IS DUE AT THE TIME OF SERVICE. °Cash, Visa or MasterCard. Sliding scale - $30 minimum per service. °Best way to get seen: °Come in to office, complete packet and make an appointment - need proof of income °or support monies for each household member and proof of [place] residence. °Usually takes about a month to get in. °  °  °[facility] °[phone] °  °Location: °[address]   [place] °Clinic Hours: Walk-in Urgent Care Dental Services are offered Monday-Friday  mornings only. °The numbers of emergencies accepted daily is limited to the number of °providers available. °Maximum 15 - Mondays, Wednesdays & Thursdays °Maximum 10 - Tuesdays & Fridays °Services: °You do not need to be a [place] resident to be seen for a dental emergency. °Emergencies are defined as pain, swelling, abnormal bleeding, or dental trauma. Walkins will receive x-rays if needed. °NOTE: Dental cleaning is not an emergency. °Payment Options: °PAYMENT IS DUE AT THE TIME OF SERVICE. °Minimum co-pay is $40.00 for uninsured patients. °Minimum co-pay is $3.00 for Medicaid with dental coverage. °Dental Insurance is accepted and must be presented at time of visit. °Medicare does not cover dental. °Forms of payment: Cash, credit card, checks. °Best way to get seen: °If not previously registered with the clinic, walk-in dental registration begins at 7:15 am and is on a first come/first serve basis. °If previously registered with the clinic, call to make an appointment. °  °  °[facility] °919-776-4359 °[place] RESIDENTS ONLY °  °Location: °[address], [place], [place] °Clinic Hours: °Mon-Thu 10a-2p °Services: Extractions only! °Payment Options: °FREE (donations accepted) - bring proof of income or support °Best way to get seen: °Call and schedule an appointment OR come at 8am on the 1st Monday of every month (except for holidays) when it is first come/first served. °  °  °[facility] °[phone] °  °Location: °[address], [place] °Clinic Hours: °Friday mornings °Services, Payment Options, Best way to get seen: °Call for info °

## 2018-04-21 NOTE — ED Provider Notes (Signed)
[name] Emergency Department Provider Note  ____________________________________________   First MD Initiated Contact with Patient 04/21/18 1610     (approximate)  I have reviewed the triage vital signs and the nursing notes.   HISTORY  Chief Complaint Dental Pain    HPI Sarah Steele is a 37 y.o. female resents emergency department complaining of tooth pain and spatial swelling.  She states face began to swell yesterday.  She does have poor dentition.  The only antibiotic she can take without difficulty Cipro.  She denies any fever or chills.  SHe does not have a regular dentist at this time  Past Medical History:  Diagnosis Date  . Arthritis   . Back pain   . DDD (degenerative disc disease)   . Heart murmur     There are no active problems to display for this patient.   History reviewed. No pertinent surgical history.  Prior to Admission medications   Medication Sig Start Date End Date Taking? Authorizing Provider  ALPRAZolam (XANAX) 0.25 MG tablet Take 0.25 mg by mouth 3 (three) times daily as needed for anxiety.    [provider]  ciprofloxacin (CIPRO) 250 MG tablet Take 1 tablet (250 mg total) by mouth 2 (two) times daily. 04/21/18   Waneta Fitting, Linden Dolin, PA-C  ciprofloxacin-dexamethasone (CIPRODEX) OTIC suspension Place 4 drops into the left ear 2 (two) times daily. 01/28/18   Gregor Hams, MD  EPINEPHrine (EPIPEN JR) 0.15 MG/0.3ML injection Inject 0.15 mg into the muscle as needed for anaphylaxis.    [provider]  ibuprofen (ADVIL,MOTRIN) 200 MG tablet Take 800 mg by mouth every 6 (six) hours as needed for pain.     [provider]  loratadine (CLARITIN) 10 MG tablet Take 10 mg by mouth daily.    [provider]  meloxicam (MOBIC) 15 MG tablet Take 1 tablet (15 mg total) by mouth daily. 01/05/17   Hagler, Jami L, PA-C    Allergies Aleve cold & [pseudoephedrine-naproxen na er]; Amoxicillin; Chloraseptic  sore throat [acetaminophen]; Clindamycin/lincomycin; Doxycycline; Flagyl [metronidazole hcl]; Influenza vaccines; Neurontin [gabapentin]; Nubain [nalbuphine hcl]; Penicillins; Phenergan [promethazine hcl]; Prednisone; Septra [bactrim]; and Ultram [tramadol hcl]  No family history on file.  Social History Social History   Tobacco Use  . Smoking status: Current Every Day Smoker    Packs/day: 0.25    Types: Cigarettes  . Smokeless tobacco: Never Used  Substance Use Topics  . Alcohol use: Yes    Comment: once every 6 months  . Drug use: No    Review of Systems  Constitutional: No fever/chills Eyes: No visual changes. ENT: No sore throat.  Positive for dental pain Respiratory: Denies cough Genitourinary: Negative for dysuria. Musculoskeletal: Negative for back pain. Skin: Negative for rash.    ____________________________________________   PHYSICAL EXAM:  VITAL SIGNS: ED Triage Vitals  Enc Vitals Group     BP 04/21/18 1601 (!) 159/89     Pulse Rate 04/21/18 1601 75     Resp --      Temp 04/21/18 1601 98.4 F (36.9 C)     Temp Source 04/21/18 1601 Oral     SpO2 04/21/18 1601 99 %     Weight 04/21/18 1559 280 lb (127 kg)     Height 04/21/18 1559 5\' 9"  (1.753 m)     Head Circumference --      Peak Flow --      Pain Score 04/21/18 1559 10     Pain Loc --  Pain Edu? --      Excl. in [place]? --     Constitutional: Alert and oriented. Well appearing and in no acute distress. Eyes: Conjunctivae are normal.  Head: Atraumatic. Nose: No congestion/rhinnorhea. Mouth/Throat: Mucous membranes are moist.  Positive for poor dentition.  There is some mild swelling along the right side of the face. Neck: Supple, no lymphadenopathy is noted Cardiovascular: Normal rate, regular rhythm. Respiratory: Normal respiratory effort.  No retractions GU: deferred Musculoskeletal: FROM all extremities, warm and well perfused Neurologic:  Normal speech and language.  Skin:  Skin is warm,  dry and intact. No rash noted. Psychiatric: Mood and affect are normal. Speech and behavior are normal.  ____________________________________________   LABS (all labs ordered are listed, but only abnormal results are displayed)  Labs Reviewed - No data to display ____________________________________________   ____________________________________________  RADIOLOGY    ____________________________________________   PROCEDURES  Procedure(s) performed: No  Procedures    ____________________________________________   INITIAL IMPRESSION / ASSESSMENT AND PLAN / ED COURSE  Pertinent labs & imaging results that were available during my care of the patient were reviewed by me and considered in my medical decision making (see chart for details).  Patient is a 37 year old female presents emergency department complaining of dental pain and abscess.  On physical exam patient has a mild amount of swelling on the right side of the face.  She has widespread poor dentition.  Severe dental caries in the front tooth.  Plan findings of the patient.  She was given a prescription for Cipro as this is the only medication that she does not have anaphylaxis with.  She is to follow-up with the dental clinic.  List of dental clinics in the area was provided for the patient.  She states she understands and will comply with instructions.  She was discharged in stable condition     As part of my medical decision making, I reviewed the following data within the [place] notes reviewed and incorporated, Notes from prior ED visits and [place] Controlled Substance Database  ____________________________________________   FINAL CLINICAL IMPRESSION(S) / ED DIAGNOSES  Final diagnoses:  Dental abscess      NEW MEDICATIONS STARTED DURING THIS VISIT:  Discharge Medication List as of 04/21/2018  4:33 PM    START taking these medications   Details  ciprofloxacin (CIPRO) 250  MG tablet Take 1 tablet (250 mg total) by mouth 2 (two) times daily., Starting Tue 04/21/2018, Print         Note:  This document was prepared using Dragon voice recognition software and may include unintentional dictation errors.    Versie Starks, PA-C 04/21/18 Rayburn Felt, MD 04/22/18 2241

## 2018-04-21 NOTE — ED Triage Notes (Signed)
Pt states she has a tooth break off on the right sided about 2 weeks ago and yesterday began having facial swelling.Marland Kitchen

## 2018-06-29 ENCOUNTER — Ambulatory Visit
Admission: RE | Admit: 2018-06-29 | Discharge: 2018-06-29 | Disposition: A | Discharge status: Home / Self Care | Payer: 59 | Source: Ambulatory Visit | Attending: Cardiovascular Disease | Admitting: Cardiovascular Disease

## 2018-06-29 ENCOUNTER — Other Ambulatory Visit: Payer: Self-pay | Admitting: Cardiovascular Disease

## 2018-06-29 DIAGNOSIS — M542 Cervicalgia: Secondary | ICD-10-CM

## 2018-08-07 ENCOUNTER — Emergency Department
Admission: EM | Admit: 2018-08-07 | Discharge: 2018-08-07 | Disposition: A | Discharge status: Home / Self Care | Payer: 59 | Attending: Emergency Medicine | Admitting: Emergency Medicine

## 2018-08-07 ENCOUNTER — Other Ambulatory Visit: Payer: Self-pay

## 2018-08-07 ENCOUNTER — Encounter: Payer: Self-pay | Admitting: Emergency Medicine

## 2018-08-07 DIAGNOSIS — N760 Acute vaginitis: Secondary | ICD-10-CM | POA: Insufficient documentation

## 2018-08-07 DIAGNOSIS — R1030 Lower abdominal pain, unspecified: Secondary | ICD-10-CM | POA: Diagnosis present

## 2018-08-07 DIAGNOSIS — F17210 Nicotine dependence, cigarettes, uncomplicated: Secondary | ICD-10-CM | POA: Diagnosis not present

## 2018-08-07 LAB — URINALYSIS, COMPLETE (UACMP) WITH MICROSCOPIC
Bacteria, UA: NONE SEEN
Bilirubin Urine: NEGATIVE
Glucose, UA: NEGATIVE mg/dL
Hgb urine dipstick: NEGATIVE
Ketones, ur: NEGATIVE mg/dL
Leukocytes, UA: NEGATIVE
Nitrite: NEGATIVE
Protein, ur: NEGATIVE mg/dL
Specific Gravity, Urine: 1.025 (ref 1.005–1.030)
pH: 5 (ref 5.0–8.0)

## 2018-08-07 LAB — WET PREP, GENITAL
CLUE CELLS WET PREP: NONE SEEN
SPERM: NONE SEEN
Trich, Wet Prep: NONE SEEN
Yeast Wet Prep HPF POC: NONE SEEN

## 2018-08-07 LAB — CHLAMYDIA/NGC RT PCR (ARMC ONLY)
CHLAMYDIA TR: NOT DETECTED
N gonorrhoeae: NOT DETECTED

## 2018-08-07 LAB — POCT PREGNANCY, URINE: Preg Test, Ur: NEGATIVE

## 2018-08-07 NOTE — ED Provider Notes (Signed)
[facility] Emergency Department Provider Note   ____________________________________________    I have reviewed the triage vital signs and the nursing notes.   HISTORY  Chief Complaint Abdominal Pain and Vaginal Itching     HPI Sarah Steele is a 37 y.o. female who presents today with complaints of lower abdominal mild cramping sensation, vaginal itching over the last 2 to 3 days.  She reports this feels similar to when she has had bacterial vaginosis in the past.  She does report a new sexual partner 1 month ago.  Denies vaginal discharge.  No fevers or chills.  No dysuria.  Past Medical History:  Diagnosis Date  . Arthritis   . Back pain   . DDD (degenerative disc disease)   . Heart murmur     There are no active problems to display for this patient.   History reviewed. No pertinent surgical history.  Prior to Admission medications   Medication Sig Start Date End Date Taking? Authorizing Provider  ALPRAZolam Duanne Moron) 0.25 MG tablet Take 0.25 mg by mouth daily as needed for anxiety.    Yes [provider]  EPINEPHrine (EPIPEN 2-PAK) 0.3 mg/0.3 mL IJ SOAJ injection Inject 0.3 mg into the muscle once.   Yes [provider]  ibuprofen (ADVIL,MOTRIN) 200 MG tablet Take 800 mg by mouth 2 (two) times daily as needed for mild pain.    Yes [provider]  oxyCODONE-acetaminophen (PERCOCET/ROXICET) 5-325 MG tablet Take 1 tablet by mouth daily as needed for severe pain.  07/27/18  Yes [provider]  ciprofloxacin-dexamethasone (CIPRODEX) OTIC suspension Place 4 drops into the left ear 2 (two) times daily. Patient not taking: Reported on 08/07/2018 01/28/18   Gregor Hams, MD  meloxicam (MOBIC) 15 MG tablet Take 1 tablet (15 mg total) by mouth daily. Patient not taking: Reported on 08/07/2018 01/05/17   Hagler, Jami L, PA-C     Allergies Aleve cold & [pseudoephedrine-naproxen na er]; Amoxicillin; Chloraseptic sore  throat [acetaminophen]; Clindamycin/lincomycin; Doxycycline; Flagyl [metronidazole hcl]; Influenza vaccines; Neurontin [gabapentin]; Nubain [nalbuphine hcl]; Penicillins; Phenergan [promethazine hcl]; Prednisone; Septra [bactrim]; and Ultram [tramadol hcl]  No family history on file.  Social History Social History   Tobacco Use  . Smoking status: Current Every Day Smoker    Packs/day: 0.25    Types: Cigarettes  . Smokeless tobacco: Never Used  Substance Use Topics  . Alcohol use: Yes    Comment: once every 6 months  . Drug use: No    Review of Systems  Constitutional: No fever/chills  Gastrointestinal:  No nausea, no vomiting.   Genitourinary: As above Musculoskeletal: Negative for back pain. Skin: Negative for rash.     ____________________________________________   PHYSICAL EXAM:  VITAL SIGNS: ED Triage Vitals [08/07/18 0638]  Enc Vitals Group     BP (!) 158/88     Pulse Rate 70     Resp 20     Temp 98.3 F (36.8 C)     Temp Source Oral     SpO2 100 %     Weight 117.9 kg (260 lb)     Height 1.753 m (5\' 9" )     Head Circumference      Peak Flow      Pain Score 5     Pain Loc      Pain Edu?      Excl. in [place]?     Constitutional: Alert and oriented. No acute distress. Pleasant and interactive Eyes: Conjunctivae  are normal.   . Mouth/Throat: Mucous membranes are moist.   Cardiovascular: Normal rate, regular rhythm.  Respiratory: Normal respiratory effort.  No retractions. Genitourinary: No evidence of cervicitis, no CMT, small amount of clear discharge   Skin:  Skin is warm, dry and intact. No rash noted.   ____________________________________________   LABS (all labs ordered are listed, but only abnormal results are displayed)  Labs Reviewed  WET PREP, GENITAL - Abnormal; Notable for the following components:      Result Value   WBC, Wet Prep HPF POC MODERATE (*)    All other components within normal limits  URINALYSIS, COMPLETE (UACMP) WITH  MICROSCOPIC - Abnormal; Notable for the following components:   Color, Urine YELLOW (*)    APPearance CLEAR (*)    All other components within normal limits  CHLAMYDIA/NGC RT PCR ([facility] ONLY)  POC URINE PREG, ED  POCT PREGNANCY, URINE   ____________________________________________  EKG   ____________________________________________  RADIOLOGY  None ____________________________________________   PROCEDURES  Procedure(s) performed: No  Procedures   Critical Care performed: No ____________________________________________   INITIAL IMPRESSION / ASSESSMENT AND PLAN / ED COURSE  Pertinent labs & imaging results that were available during my care of the patient were reviewed by me and considered in my medical decision making (see chart for details).  Patient presents with primary complaint of vaginal itching, very mild abdominal cramping.  Abdominal exam is quite reassuring.  Will send wet prep, GC chlamydia reevaluate, suspect bacterial vaginosis  Wet prep is overall reassuring, some white blood cells.  Suspect vaginitis, will discharge with follow-up with GYN, follow-up on GC chlamydia   ____________________________________________   FINAL CLINICAL IMPRESSION(S) / ED DIAGNOSES  Final diagnoses:  Acute vaginitis      NEW MEDICATIONS STARTED DURING THIS VISIT:  Discharge Medication List as of 08/07/2018  9:23 AM       Note:  This document was prepared using Dragon voice recognition software and may include unintentional dictation errors.    Lavonia Drafts, MD 08/07/18 1141

## 2018-08-07 NOTE — ED Triage Notes (Signed)
Pt presents to ED with c/o sharp lower abd pain and vaginal itching. Onset 5 days ago. Increase in vaginal discharge. Unprotected sex about a month ago.

## 2019-02-19 ENCOUNTER — Emergency Department
Admission: EM | Admit: 2019-02-19 | Discharge: 2019-02-19 | Disposition: A | Discharge status: Home / Self Care | Payer: 59 | Attending: Emergency Medicine | Admitting: Emergency Medicine

## 2019-02-19 ENCOUNTER — Other Ambulatory Visit: Payer: Self-pay

## 2019-02-19 DIAGNOSIS — Z79899 Other long term (current) drug therapy: Secondary | ICD-10-CM | POA: Insufficient documentation

## 2019-02-19 DIAGNOSIS — J029 Acute pharyngitis, unspecified: Secondary | ICD-10-CM | POA: Diagnosis present

## 2019-02-19 DIAGNOSIS — F17210 Nicotine dependence, cigarettes, uncomplicated: Secondary | ICD-10-CM | POA: Diagnosis not present

## 2019-02-19 DIAGNOSIS — J039 Acute tonsillitis, unspecified: Secondary | ICD-10-CM | POA: Insufficient documentation

## 2019-02-19 LAB — GROUP A STREP BY PCR: GROUP A STREP BY PCR: NOT DETECTED

## 2019-02-19 MED ORDER — CIPROFLOXACIN HCL 500 MG PO TABS
500.0000 mg | ORAL_TABLET | Freq: Once | ORAL | Status: AC
  Administered 2019-02-19: 500 mg via ORAL
  Filled 2019-02-19: qty 1

## 2019-02-19 MED ORDER — CIPROFLOXACIN HCL 500 MG PO TABS: 500.0000 mg | ORAL_TABLET | Freq: Two times a day (BID) | ORAL | 0 refills | Status: DC

## 2019-02-19 NOTE — Discharge Instructions (Signed)
1.  Take antibiotic as prescribed (Cipro 500 mg twice daily x7 days). 2.  Return to the ER for worsening symptoms, persistent vomiting, difficulty breathing or other concerns.

## 2019-02-19 NOTE — ED Triage Notes (Signed)
Pt In with co sore throat, earache, and headache since yesterday.

## 2019-02-19 NOTE — ED Provider Notes (Signed)
[facility] Emergency Department Provider Note   ____________________________________________   First MD Initiated Contact with Patient 02/19/19 [phone]     (approximate)  I have reviewed the triage vital signs and the nursing notes.   HISTORY  Chief Complaint Sore Throat    HPI Sarah Steele is a 38 y.o. female who presents to the ED from home with a chief complaint of sore throat. Patient reports a one day history of sore throat, bilateral earache, dry cough and headache. Co-worker with strep throat. Denies associated fever, chills, chest pain, shortness of breath, abdominal pain, nausea, vomiting or diarrhea. Denies recent travel or trauma.    Past Medical History:  Diagnosis Date  . Arthritis   . Back pain   . DDD (degenerative disc disease)   . Heart murmur     There are no active problems to display for this patient.   No past surgical history on file.  Prior to Admission medications   Medication Sig Start Date End Date Taking? Authorizing Provider  ALPRAZolam Duanne Moron) 0.25 MG tablet Take 0.25 mg by mouth daily as needed for anxiety.     [provider]  ciprofloxacin (CIPRO) 500 MG tablet Take 1 tablet (500 mg total) by mouth 2 (two) times daily. 02/19/19   Paulette Blanch, MD  ciprofloxacin-dexamethasone (CIPRODEX) OTIC suspension Place 4 drops into the left ear 2 (two) times daily. Patient not taking: Reported on 08/07/2018 01/28/18   Gregor Hams, MD  EPINEPHrine (EPIPEN 2-PAK) 0.3 mg/0.3 mL IJ SOAJ injection Inject 0.3 mg into the muscle once.    [provider]  ibuprofen (ADVIL,MOTRIN) 200 MG tablet Take 800 mg by mouth 2 (two) times daily as needed for mild pain.     [provider]  meloxicam (MOBIC) 15 MG tablet Take 1 tablet (15 mg total) by mouth daily. Patient not taking: Reported on 08/07/2018 01/05/17   Hagler, Jami L, PA-C  oxyCODONE-acetaminophen (PERCOCET/ROXICET) 5-325 MG tablet Take 1 tablet by mouth  daily as needed for severe pain.  07/27/18   [provider]    Allergies Aleve cold & [pseudoephedrine-naproxen na er]; Amoxicillin; Chloraseptic sore throat [acetaminophen]; Clindamycin/lincomycin; Doxycycline; Flagyl [metronidazole hcl]; Influenza vaccines; Neurontin [gabapentin]; Nubain [nalbuphine hcl]; Penicillins; Phenergan [promethazine hcl]; Prednisone; Septra [bactrim]; and Ultram [tramadol hcl]  No family history on file.  Social History Social History   Tobacco Use  . Smoking status: Current Every Day Smoker    Packs/day: 0.25    Types: Cigarettes  . Smokeless tobacco: Never Used  Substance Use Topics  . Alcohol use: Yes    Comment: once every 6 months  . Drug use: No    Review of Systems  Constitutional: No fever/chills Eyes: No visual changes. ENT: Positive for bilateral ear pain and sore throat. Cardiovascular: Denies chest pain. Respiratory: Positive for dry cough. Denies shortness of breath. Gastrointestinal: No abdominal pain.  No nausea, no vomiting.  No diarrhea.  No constipation. Genitourinary: Negative for dysuria. Musculoskeletal: Negative for back pain. Skin: Negative for rash. Neurological: Negative for headaches, focal weakness or numbness.   ____________________________________________   PHYSICAL EXAM:  VITAL SIGNS: ED Triage Vitals  Enc Vitals Group     BP 02/19/19 0413 (!) 153/84     Pulse Rate 02/19/19 0413 93     Resp 02/19/19 0413 20     Temp 02/19/19 0413 98.4 F (36.9 C)     Temp Source 02/19/19 0413 Oral     SpO2 02/19/19 0413 100 %  Weight 02/19/19 0414 276 lb (125.2 kg)     Height 02/19/19 0414 5\' 9"  (1.753 m)     Head Circumference --      Peak Flow --      Pain Score 02/19/19 0414 8     Pain Loc --      Pain Edu? --      Excl. in [place]? --     Constitutional: Alert and oriented. Well appearing and in no acute distress. Eyes: Conjunctivae are normal. PERRL. EOMI. Head: Atraumatic. Ears: Bilateral TMs dull  with mild fluid. Nose: Congestion/rhinnorhea. Mouth/Throat: Mucous membranes are moist.  Oropharynx mildly erythematous with bilateral mild tonsillar swelling and exudates. No PTA. Mildly hoarse voice. There is no muffled voice or drooling. Neck: No stridor.  Supple neck without meningismus. Hematological/Lymphatic/Immunilogical: Shotty anterior cervical lymphadenopathy. Cardiovascular: Normal rate, regular rhythm. Grossly normal heart sounds.  Good peripheral circulation. Respiratory: Normal respiratory effort.  No retractions. Lungs CTAB. Gastrointestinal: Soft and nontender. No distention. No abdominal bruits. No CVA tenderness. Musculoskeletal: No lower extremity tenderness nor edema.  No joint effusions. Neurologic:  Normal speech and language. No gross focal neurologic deficits are appreciated. No gait instability. Skin:  Skin is warm, dry and intact. No rash noted. No petechiae. Psychiatric: Mood and affect are normal. Speech and behavior are normal.  ____________________________________________   LABS (all labs ordered are listed, but only abnormal results are displayed)  Labs Reviewed  GROUP A STREP BY PCR   ____________________________________________  EKG  None ____________________________________________  RADIOLOGY  ED MD interpretation:  None  Official radiology report(s): No results found.  ____________________________________________   PROCEDURES  Procedure(s) performed (including Critical Care):  Procedures   ____________________________________________   INITIAL IMPRESSION / ASSESSMENT AND PLAN / ED COURSE  As part of my medical decision making, I reviewed the following data within the [place] notes reviewed and incorporated, Labs reviewed, Old chart reviewed and Notes from prior ED visits    38 year old female who presents with sore throat, congestion, dry cough, strep is negative.  Tonsillitis by clinical exam.   Discussed with patient at length as she is allergic to 14 medications including the top antibiotics I would choose to treat her tonsillitis with.  She tells me the only antibiotic she has ever been able to tolerate is Cipro.  Has never taken Levaquin.  She is allergic to prednisone so cannot treat with Decadron.  Will treat with Cipro although I did let her know if this is not choice to treat tonsillitis with. Strict return precautions given. Patient verbalizes understanding and agrees with plan of care.      ____________________________________________   FINAL CLINICAL IMPRESSION(S) / ED DIAGNOSES  Final diagnoses:  Tonsillitis     ED Discharge Orders         Ordered    ciprofloxacin (CIPRO) 500 MG tablet  2 times daily     02/19/19 7902           Note:  This document was prepared using Dragon voice recognition software and may include unintentional dictation errors.   Paulette Blanch, MD 02/19/19 [phone]

## 2019-02-19 NOTE — ED Notes (Signed)
Pt verbalized understanding of d/c instructions, rx, and f/u care. No further questions at this time. Pt ambulatory to the exit with steady gait.  

## 2019-04-30 ENCOUNTER — Emergency Department
Admission: EM | Admit: 2019-04-30 | Discharge: 2019-04-30 | Disposition: A | Discharge status: Home / Self Care | Payer: 59 | Attending: Emergency Medicine | Admitting: Emergency Medicine

## 2019-04-30 ENCOUNTER — Other Ambulatory Visit: Payer: Self-pay

## 2019-04-30 ENCOUNTER — Encounter: Payer: Self-pay | Admitting: Emergency Medicine

## 2019-04-30 DIAGNOSIS — F17210 Nicotine dependence, cigarettes, uncomplicated: Secondary | ICD-10-CM | POA: Diagnosis not present

## 2019-04-30 DIAGNOSIS — J029 Acute pharyngitis, unspecified: Secondary | ICD-10-CM | POA: Diagnosis present

## 2019-04-30 DIAGNOSIS — J039 Acute tonsillitis, unspecified: Secondary | ICD-10-CM | POA: Insufficient documentation

## 2019-04-30 DIAGNOSIS — Z79899 Other long term (current) drug therapy: Secondary | ICD-10-CM | POA: Diagnosis not present

## 2019-04-30 MED ORDER — CIPROFLOXACIN HCL 500 MG PO TABS: 500.0000 mg | ORAL_TABLET | Freq: Two times a day (BID) | ORAL | 0 refills | Status: DC

## 2019-04-30 NOTE — Discharge Instructions (Addendum)
Follow-up with your regular doctor if not better in 2 to 3 days.  Follow-up with [facility] for evaluation of tonsillitis.  They would also be able to do testing to see if you are actually allergic to all of the antibiotics on your list.  Return to the emergency department for worsening.

## 2019-04-30 NOTE — ED Provider Notes (Signed)
[facility] Emergency Department Provider Note  ____________________________________________   First MD Initiated Contact with Patient 04/30/19 1332     (approximate)  I have reviewed the triage vital signs and the nursing notes.   HISTORY  Chief Complaint Sore Throat    HPI Sarah Steele is a 38 y.o. female presents emergency department complaining of sore throat with pus in the back of her throat.  She states she has a history of tonsillitis.  The only antibiotic she can take without anaphylaxis is Cipro.  She also cannot take steroids.  She denies any fever or chills.  She denies chest pain or shortness of breath.    Past Medical History:  Diagnosis Date  . Arthritis   . Back pain   . DDD (degenerative disc disease)   . Heart murmur     There are no active problems to display for this patient.   History reviewed. No pertinent surgical history.  Prior to Admission medications   Medication Sig Start Date End Date Taking? Authorizing Provider  ALPRAZolam Duanne Moron) 0.25 MG tablet Take 0.25 mg by mouth daily as needed for anxiety.     [provider]  ciprofloxacin (CIPRO) 500 MG tablet Take 1 tablet (500 mg total) by mouth 2 (two) times daily. 04/30/19   Fisher, Linden Dolin, PA-C  EPINEPHrine (EPIPEN 2-PAK) 0.3 mg/0.3 mL IJ SOAJ injection Inject 0.3 mg into the muscle once.    [provider]  ibuprofen (ADVIL,MOTRIN) 200 MG tablet Take 800 mg by mouth 2 (two) times daily as needed for mild pain.     [provider]  meloxicam (MOBIC) 15 MG tablet Take 1 tablet (15 mg total) by mouth daily. Patient not taking: Reported on 08/07/2018 01/05/17   Hagler, Jami L, PA-C  oxyCODONE-acetaminophen (PERCOCET/ROXICET) 5-325 MG tablet Take 1 tablet by mouth daily as needed for severe pain.  07/27/18   [provider]    Allergies Aleve cold & [pseudoephedrine-naproxen na er]; Amoxicillin; Chloraseptic sore throat [acetaminophen];  Clindamycin/lincomycin; Doxycycline; Flagyl [metronidazole hcl]; Influenza vaccines; Neurontin [gabapentin]; Nubain [nalbuphine hcl]; Penicillins; Phenergan [promethazine hcl]; Prednisone; Septra [bactrim]; and Ultram [tramadol hcl]  No family history on file.  Social History Social History   Tobacco Use  . Smoking status: Current Every Day Smoker    Packs/day: 0.25    Types: Cigarettes  . Smokeless tobacco: Never Used  Substance Use Topics  . Alcohol use: Yes    Comment: once every 6 months  . Drug use: No    Review of Systems  Constitutional: No fever/chills Eyes: No visual changes. ENT: Positive sore throat. Respiratory: Denies cough Genitourinary: Negative for dysuria. Musculoskeletal: Negative for back pain. Skin: Negative for rash.    ____________________________________________   PHYSICAL EXAM:  VITAL SIGNS: ED Triage Vitals  Enc Vitals Group     BP 04/30/19 1300 (!) 148/84     Pulse Rate 04/30/19 1300 87     Resp 04/30/19 1300 18     Temp 04/30/19 1300 98.4 F (36.9 C)     Temp Source 04/30/19 1300 Oral     SpO2 04/30/19 1300 99 %     Weight 04/30/19 1311 260 lb (117.9 kg)     Height 04/30/19 1311 5\' 9"  (1.753 m)     Head Circumference --      Peak Flow --      Pain Score 04/30/19 1311 4     Pain Loc --      Pain Edu? --  Excl. in [place]? --     Constitutional: Alert and oriented. Well appearing and in no acute distress. Eyes: Conjunctivae are normal.  Head: Atraumatic. Nose: No congestion/rhinnorhea. Mouth/Throat: Mucous membranes are moist.  Throat appears red with exudate on the tonsils Neck:  supple no lymphadenopathy noted Cardiovascular: Normal rate, regular rhythm. Heart sounds are normal Respiratory: Normal respiratory effort.  No retractions, lungs c t a  GU: deferred Musculoskeletal: FROM all extremities, warm and well perfused Neurologic:  Normal speech and language.  Skin:  Skin is warm, dry and intact. No rash noted. Psychiatric:  Mood and affect are normal. Speech and behavior are normal.  ____________________________________________   LABS (all labs ordered are listed, but only abnormal results are displayed)  Labs Reviewed - No data to display ____________________________________________   ____________________________________________  RADIOLOGY    ____________________________________________   PROCEDURES  Procedure(s) performed: No  Procedures    ____________________________________________   INITIAL IMPRESSION / ASSESSMENT AND PLAN / ED COURSE  Pertinent labs & imaging results that were available during my care of the patient were reviewed by me and considered in my medical decision making (see chart for details).   Patient is a 38 year old female presents emergency department with complaints of sore throat with exudate  Physical exam shows tonsils are swollen.  There is a white exudate noted.  Neck supple.  Remainder the exam is unremarkable  Explained findings to the patient.  She has a history of the same.  She has multiple medication allergies.  She can only take Cipro as the antibiotic.  She was given a prescription for Cipro 500 twice daily for 7 days.  She is to follow-up with [facility].  Explained to her that they can address the frequent tonsillitis and also consult with her on the multiple allergy she has 2 antibiotics.  She states she understands will comply.  She was discharged stable condition and given a work note for 2 days.     As part of my medical decision making, I reviewed the following data within the [place] notes reviewed and incorporated, Old chart reviewed, Notes from prior ED visits and [place] Controlled Substance Database  ____________________________________________   FINAL CLINICAL IMPRESSION(S) / ED DIAGNOSES  Final diagnoses:  Acute tonsillitis, unspecified etiology      NEW MEDICATIONS STARTED DURING THIS VISIT:  Current  Discharge Medication List       Note:  This document was prepared using Dragon voice recognition software and may include unintentional dictation errors.    Versie Starks, PA-C 04/30/19 1457    Carrie Mew, MD 04/30/19 [phone]

## 2019-04-30 NOTE — ED Triage Notes (Signed)
Patient presents to the ED with sore throat and "puss" to the back of her throat.  Patient reports tonsillitis in March.  Patient states, "I don't want to let it get that bad this time."  Patient denies cough, congestion and shortness of breath.

## 2019-07-17 ENCOUNTER — Emergency Department: Payer: 59

## 2019-07-17 ENCOUNTER — Other Ambulatory Visit: Payer: Self-pay

## 2019-07-17 ENCOUNTER — Emergency Department
Admission: EM | Admit: 2019-07-17 | Discharge: 2019-07-17 | Disposition: A | Discharge status: Home / Self Care | Payer: 59 | Attending: Emergency Medicine | Admitting: Emergency Medicine

## 2019-07-17 DIAGNOSIS — F17210 Nicotine dependence, cigarettes, uncomplicated: Secondary | ICD-10-CM | POA: Diagnosis not present

## 2019-07-17 DIAGNOSIS — Z20828 Contact with and (suspected) exposure to other viral communicable diseases: Secondary | ICD-10-CM | POA: Diagnosis not present

## 2019-07-17 DIAGNOSIS — R5383 Other fatigue: Secondary | ICD-10-CM | POA: Insufficient documentation

## 2019-07-17 DIAGNOSIS — R0602 Shortness of breath: Secondary | ICD-10-CM | POA: Diagnosis not present

## 2019-07-17 DIAGNOSIS — R509 Fever, unspecified: Secondary | ICD-10-CM | POA: Diagnosis present

## 2019-07-17 DIAGNOSIS — Z20822 Contact with and (suspected) exposure to covid-19: Secondary | ICD-10-CM

## 2019-07-17 LAB — URINALYSIS, COMPLETE (UACMP) WITH MICROSCOPIC
Bilirubin Urine: NEGATIVE
Glucose, UA: NEGATIVE mg/dL
Hgb urine dipstick: NEGATIVE
Ketones, ur: NEGATIVE mg/dL
Leukocytes,Ua: NEGATIVE
Nitrite: NEGATIVE
Protein, ur: NEGATIVE mg/dL
Specific Gravity, Urine: 1.017 (ref 1.005–1.030)
pH: 5 (ref 5.0–8.0)

## 2019-07-17 LAB — CBC WITH DIFFERENTIAL/PLATELET
Abs Immature Granulocytes: 0.02 10*3/uL (ref 0.00–0.07)
Basophils Absolute: 0 10*3/uL (ref 0.0–0.1)
Basophils Relative: 0 %
Eosinophils Absolute: 0.2 10*3/uL (ref 0.0–0.5)
Eosinophils Relative: 2 %
HCT: 40.1 % (ref 36.0–46.0)
Hemoglobin: 12.9 g/dL (ref 12.0–15.0)
Immature Granulocytes: 0 %
Lymphocytes Relative: 27 %
Lymphs Abs: 1.6 10*3/uL (ref 0.7–4.0)
MCH: 28.2 pg (ref 26.0–34.0)
MCHC: 32.2 g/dL (ref 30.0–36.0)
MCV: 87.6 fL (ref 80.0–100.0)
Monocytes Absolute: 0.3 10*3/uL (ref 0.1–1.0)
Monocytes Relative: 5 %
Neutro Abs: 4 10*3/uL (ref 1.7–7.7)
Neutrophils Relative %: 66 %
Platelets: 255 10*3/uL (ref 150–400)
RBC: 4.58 MIL/uL (ref 3.87–5.11)
RDW: 13.9 % (ref 11.5–15.5)
WBC: 6.2 10*3/uL (ref 4.0–10.5)
nRBC: 0 % (ref 0.0–0.2)

## 2019-07-17 LAB — POCT PREGNANCY, URINE: Preg Test, Ur: NEGATIVE

## 2019-07-17 LAB — COMPREHENSIVE METABOLIC PANEL
ALT: 10 U/L (ref 0–44)
AST: 12 U/L — ABNORMAL LOW (ref 15–41)
Albumin: 4 g/dL (ref 3.5–5.0)
Alkaline Phosphatase: 76 U/L (ref 38–126)
Anion gap: 6 (ref 5–15)
BUN: 16 mg/dL (ref 6–20)
CO2: 27 mmol/L (ref 22–32)
Calcium: 8.9 mg/dL (ref 8.9–10.3)
Chloride: 107 mmol/L (ref 98–111)
Creatinine, Ser: 0.87 mg/dL (ref 0.44–1.00)
GFR calc Af Amer: >60 mL/min (ref >60)
GFR calc non Af Amer: >60 mL/min (ref >60)
Glucose, Bld: 122 mg/dL — ABNORMAL HIGH (ref 70–99)
Potassium: 4.2 mmol/L (ref 3.5–5.1)
Sodium: 140 mmol/L (ref 135–145)
Total Bilirubin: 0.4 mg/dL (ref 0.3–1.2)
Total Protein: 6.6 g/dL (ref 6.5–8.1)

## 2019-07-17 LAB — TROPONIN I (HIGH SENSITIVITY): Troponin I (High Sensitivity): 2 ng/L (ref <18)

## 2019-07-17 MED ORDER — ACETAMINOPHEN 500 MG PO TABS
1000.0000 mg | ORAL_TABLET | Freq: Once | ORAL | Status: AC
  Administered 2019-07-17: 1000 mg via ORAL
  Filled 2019-07-17: qty 2

## 2019-07-17 MED ORDER — ONDANSETRON 4 MG PO TBDP
4.0000 mg | ORAL_TABLET | Freq: Once | ORAL | Status: AC
  Administered 2019-07-17: 4 mg via ORAL
  Filled 2019-07-17: qty 1

## 2019-07-17 NOTE — ED Triage Notes (Signed)
Patient c/o fever, fatigue, SOB, chest discomfort beginning yesterday.

## 2019-07-17 NOTE — Discharge Instructions (Addendum)
QUARANTINE INSTRUCTION  Follow these instructions at home:  Protecting others To avoid spreading the illness to other people: Quarantine in your home until you have had no cough and fever for 7 days. Household members should also be quarantine for at least 14 days after being exposed to you. Wash your hands often with soap and water. If soap and water are not available, use an alcohol-based hand sanitizer. If you have not cleaned your hands, do not touch your face. Make sure that all people in your household wash their hands well and often. Cover your nose and mouth when you cough or sneeze. Throw away used tissues. Stay home if you have any cold-like or flu-like symptoms. General instructions Go to your local pharmacy and buy a pulse oximeter (this is a machine that measures your oxygen). Check your oxygen levels at least 3 times a day. If your oxygen level is 92% or less return to the emergency room immediately Take over-the-counter and prescription medicines only as told by your health care provider. If you need medication for fever take tylenol or ibuprofen Drink enough fluid to keep your urine pale yellow. Rest at home as directed by your health care provider. Do not give aspirin to a child with the flu, because of the association with Reye's syndrome. Do not use tobacco products, including cigarettes, chewing tobacco, and e-cigarettes. If you need help quitting, ask your health care provider. Keep all follow-up visits as told by your health care provider. This is important. How is this prevented? Avoid areas where an outbreak has been reported. Avoid large groups of people. Keep a safe distance from people who are coughing and sneezing. Do not touch your face if you have not cleaned your hands. When you are around people who are sick or might be sick, wear a mask to protect yourself. Contact a health care provider if: You have symptoms of SARS (cough, fever, chest pain, shortness of  breath) that are not getting better at home. You have a fever. If you have difficulty breathing go to your local ER or call 911

## 2019-07-17 NOTE — ED Provider Notes (Signed)
[facility] Emergency Department Provider Note  ____________________________________________  Time seen: Approximately 5:57 AM  I have reviewed the triage vital signs and the nursing notes.   HISTORY  Chief Complaint Shortness of Breath, Fever, and Fatigue   HPI Sarah Steele is a 38 y.o. female with a history of arthritis and back pain who presents for evaluation of COVID-like symptoms.  Symptoms started yesterday.  Patient has had several coworkers with tested positive for COVID-19.  She has had subjective fevers, body aches, diarrhea, nausea, sore throat, headache, fatigue, mild cough and mild shortness of breath.  She denies chest pain, personal or family history of PE or DVT, recent travel immobilization, leg pain or swelling, hemoptysis, or exogenous hormones.  Patient has not tried anything at home for her symptoms.  No abdominal pain.  No history of asthma or any lung disease.  Past Medical History:  Diagnosis Date  . Arthritis   . Back pain   . DDD (degenerative disc disease)   . Heart murmur      Prior to Admission medications   Medication Sig Start Date End Date Taking? Authorizing Provider  ALPRAZolam Duanne Moron) 0.25 MG tablet Take 0.25 mg by mouth daily as needed for anxiety.     [provider]  ciprofloxacin (CIPRO) 500 MG tablet Take 1 tablet (500 mg total) by mouth 2 (two) times daily. 04/30/19   Fisher, Linden Dolin, PA-C  EPINEPHrine (EPIPEN 2-PAK) 0.3 mg/0.3 mL IJ SOAJ injection Inject 0.3 mg into the muscle once.    [provider]  ibuprofen (ADVIL,MOTRIN) 200 MG tablet Take 800 mg by mouth 2 (two) times daily as needed for mild pain.     [provider]  meloxicam (MOBIC) 15 MG tablet Take 1 tablet (15 mg total) by mouth daily. Patient not taking: Reported on 08/07/2018 01/05/17   Hagler, Jami L, PA-C  oxyCODONE-acetaminophen (PERCOCET/ROXICET) 5-325 MG tablet Take 1 tablet by mouth daily as needed for severe pain.   07/27/18   [provider]    Allergies Aleve cold & [pseudoephedrine-naproxen na er], Amoxicillin, Chloraseptic sore throat [acetaminophen], Clindamycin/lincomycin, Doxycycline, Flagyl [metronidazole hcl], Influenza vaccines, Neurontin [gabapentin], Nubain [nalbuphine hcl], Penicillins, Phenergan [promethazine hcl], Prednisone, Septra [bactrim], and Ultram [tramadol hcl]  No family history on file.  Social History Social History   Tobacco Use  . Smoking status: Current Every Day Smoker    Packs/day: 0.25    Types: Cigarettes  . Smokeless tobacco: Never Used  Substance Use Topics  . Alcohol use: Yes    Comment: once every 6 months  . Drug use: No    Review of Systems  Constitutional: + fever, body aches and fatigue Eyes: Negative for visual changes. ENT: + sore throat. Neck: No neck pain  Cardiovascular: Negative for chest pain. Respiratory: + shortness of breath, cough Gastrointestinal: Negative for abdominal pain, vomiting. + nausea and diarrhea. Genitourinary: Negative for dysuria. Musculoskeletal: Negative for back pain. Skin: Negative for rash. Neurological: Negative for weakness or numbness. + HA Psych: No SI or HI  ____________________________________________   PHYSICAL EXAM:  VITAL SIGNS: ED Triage Vitals  Enc Vitals Group     BP 07/17/19 0306 140/87     Pulse Rate 07/17/19 0306 83     Resp 07/17/19 0306 17     Temp 07/17/19 0306 98.8 F (37.1 C)     Temp src --      SpO2 07/17/19 0306 96 %     Weight 07/17/19 0307 281 lb (  127.5 kg)     Height 07/17/19 0307 5\' 9"  (1.753 m)     Head Circumference --      Peak Flow --      Pain Score 07/17/19 0306 8     Pain Loc --      Pain Edu? --      Excl. in [place]? --     Constitutional: Alert and oriented. Well appearing and in no apparent distress. HEENT:      Head: Normocephalic and atraumatic.         Eyes: Conjunctivae are normal. Sclera is non-icteric.       Mouth/Throat: Mucous membranes are  moist.       Neck: Supple with no signs of meningismus. Cardiovascular: Regular rate and rhythm. No murmurs, gallops, or rubs. 2+ symmetrical distal pulses are present in all extremities. No JVD. Respiratory: Normal respiratory effort. Lungs are clear to auscultation bilaterally. No wheezes, crackles, or rhonchi.  Gastrointestinal: Soft, non tender, and non distended with positive bowel sounds. No rebound or guarding. Musculoskeletal: Nontender with normal range of motion in all extremities. No edema, cyanosis, or erythema of extremities. Neurologic: Normal speech and language. Face is symmetric. Moving all extremities. No gross focal neurologic deficits are appreciated. Skin: Skin is warm, dry and intact. No rash noted. Psychiatric: Mood and affect are normal. Speech and behavior are normal.  ____________________________________________   LABS (all labs ordered are listed, but only abnormal results are displayed)  Labs Reviewed  COMPREHENSIVE METABOLIC PANEL - Abnormal; Notable for the following components:      Result Value   Glucose, Bld 122 (*)    AST 12 (*)    All other components within normal limits  URINALYSIS, COMPLETE (UACMP) WITH MICROSCOPIC - Abnormal; Notable for the following components:   Color, Urine YELLOW (*)    APPearance HAZY (*)    Bacteria, UA RARE (*)    All other components within normal limits  NOVEL CORONAVIRUS, NAA (HOSPITAL ORDER, SEND-OUT TO REF LAB)  CBC WITH DIFFERENTIAL/PLATELET  POC URINE PREG, ED  POCT PREGNANCY, URINE  TROPONIN I (HIGH SENSITIVITY)   ____________________________________________  EKG  ED ECG REPORT I, Rudene Re, the attending physician, personally viewed and interpreted this ECG.  Normal sinus rhythm with frequent PVCs, rate of 89, normal intervals, normal axis, no ST elevations or depressions. ____________________________________________  RADIOLOGY  I have personally reviewed the images performed during this visit  and I agree with the Radiologist's read.   Interpretation by Radiologist:  Dg Chest Port 1 View  Result Date: 07/17/2019 CLINICAL DATA:  Shortness of breath EXAM: PORTABLE CHEST 1 VIEW COMPARISON:  7 14 16  FINDINGS: The heart size and mediastinal contours are within normal limits. Both lungs are clear. The visualized skeletal structures are unremarkable. IMPRESSION: No active disease. Electronically Signed   By: Ulyses Jarred M.D.   On: 07/17/2019 05:30     ____________________________________________   PROCEDURES  Procedure(s) performed: None Procedures Critical Care performed:  None ____________________________________________   INITIAL IMPRESSION / ASSESSMENT AND PLAN / ED COURSE  38 y.o. female with a history of arthritis and back pain who presents for evaluation of COVID-like symptoms.  Patient with several exposures to COVID-19.  She is extremely well-appearing and in no distress with normal work of breathing, normal sats both at rest and with ambulation.  Lungs are clear to auscultation.  Chest x-ray shows no evidence of pneumonia.  Here she is afebrile with no signs of sepsis, normal white count.  EKG and troponin showing no evidence of myocarditis.  Discussed with the patient quarantine for herself and family members.  Discussed the importance of buying a pulse oximeter and checking her oxygenation at least 3 times a day.  Recommended return to the emergency room for severe shortness of breath, chest pain, or sats 92 or less.  Otherwise follow-up with her PCP.  COVID swab was sent.       As part of my medical decision making, I reviewed the following data within the [place] notes reviewed and incorporated, Labs reviewed , EKG interpreted , Old EKG reviewed, Old chart reviewed, Notes from prior ED visits and [place] Controlled Substance Database   Patient was evaluated in Emergency Department today for the symptoms described in the history of present  illness. Patient was evaluated in the context of the global COVID-19 pandemic, which necessitated consideration that the patient might be at risk for infection with the SARS-CoV-2 virus that causes COVID-19. Institutional protocols and algorithms that pertain to the evaluation of patients at risk for COVID-19 are in a state of rapid change based on information released by regulatory bodies including the CDC and federal and state organizations. These policies and algorithms were followed during the patient's care in the ED.   ____________________________________________   FINAL CLINICAL IMPRESSION(S) / ED DIAGNOSES   Final diagnoses:  Suspected Covid-19 Virus Infection      NEW MEDICATIONS STARTED DURING THIS VISIT:  ED Discharge Orders    None       Note:  This document was prepared using Dragon voice recognition software and may include unintentional dictation errors.    Alfred Levins, [place], MD 07/17/19 [phone]

## 2019-07-17 NOTE — ED Notes (Signed)
No peripheral IV placed this visit.   Discharge instructions reviewed with patient. Questions fielded by this RN. Patient verbalizes understanding of instructions. Patient discharged home in stable condition per [place]. No acute distress noted at time of discharge.    

## 2019-07-17 NOTE — ED Notes (Addendum)
Pt c/o of sore throat, HA, low grade fever, SOB with exertion, minor chest discomfort, malaise, cough, nausea, and within the last 24 hours nodules behind right ear.  Pt reports contact at [place] (work place) with 5-6 positive COVID  Pt denies recent meds at home, counseled on COVID precautions, meds, smoking cessation while COVID positive, quarantine and cleaning procedure

## 2019-07-20 LAB — NOVEL CORONAVIRUS, NAA (HOSP ORDER, SEND-OUT TO REF LAB; TAT 18-24 HRS): SARS-CoV-2, NAA: NOT DETECTED

## 2019-09-09 ENCOUNTER — Other Ambulatory Visit: Payer: Self-pay

## 2019-09-09 ENCOUNTER — Emergency Department
Admission: EM | Admit: 2019-09-09 | Discharge: 2019-09-09 | Disposition: A | Discharge status: Home / Self Care | Payer: 59 | Attending: Student | Admitting: Student

## 2019-09-09 DIAGNOSIS — F17210 Nicotine dependence, cigarettes, uncomplicated: Secondary | ICD-10-CM | POA: Diagnosis not present

## 2019-09-09 DIAGNOSIS — Z20828 Contact with and (suspected) exposure to other viral communicable diseases: Secondary | ICD-10-CM | POA: Diagnosis not present

## 2019-09-09 DIAGNOSIS — J029 Acute pharyngitis, unspecified: Secondary | ICD-10-CM

## 2019-09-09 LAB — SARS CORONAVIRUS 2 BY RT PCR (HOSPITAL ORDER, PERFORMED IN ~~LOC~~ HOSPITAL LAB): SARS Coronavirus 2: NEGATIVE

## 2019-09-09 LAB — URINALYSIS, COMPLETE (UACMP) WITH MICROSCOPIC
Bacteria, UA: NONE SEEN
Bilirubin Urine: NEGATIVE
Glucose, UA: NEGATIVE mg/dL
Hgb urine dipstick: NEGATIVE
Ketones, ur: NEGATIVE mg/dL
Leukocytes,Ua: NEGATIVE
Nitrite: NEGATIVE
Protein, ur: NEGATIVE mg/dL
Specific Gravity, Urine: 1.019 (ref 1.005–1.030)
pH: 5 (ref 5.0–8.0)

## 2019-09-09 LAB — GROUP A STREP BY PCR: Group A Strep by PCR: NOT DETECTED

## 2019-09-09 NOTE — ED Notes (Signed)
Pt with c/o sore throat since yesterday.

## 2019-09-09 NOTE — ED Triage Notes (Addendum)
Pt reports possible allergic reaction to a new medication, last taken last night. Reports hives over the last few days after she takes the medication that get better with benadryl. Pt recently treated with ABX for tonsillitis. Reports soreness to throat, painful to swallow, earache and cough. Unsure if sore throat is due to allergic reaction or possible reoccurring tonsillitis-she states that due to her many allergies the ABX that was prescribed for the tonsillitis "was not one that is really used for it".  Pt in NAD, RR even and unlabored. Pt able to eat and drink today. Pt denies trouble breathing. Multiple allergies known.

## 2019-09-09 NOTE — Discharge Instructions (Signed)
Follow-up with acute care your regular doctor if not better in 3 days.  Return emergency department worsening.  Gargle with warm salt water.  Drink plenty of water.  Take over-the-counter medications as needed.

## 2019-09-09 NOTE — ED Provider Notes (Signed)
[facility] Emergency Department Provider Note  ____________________________________________   First MD Initiated Contact with Patient 09/09/19 1751     (approximate)  I have reviewed the triage vital signs and the nursing notes.   HISTORY  Chief Complaint Allergic Reaction, Sore Throat, and Cough    HPI Sarah Steele is a 38 y.o. female presents emergency department complaining of questionable fever and chills, body aches, throat pain, some questionable UTI symptoms, denies new cough as she has a smoker's cough.  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  She denies any vomiting or diarrhea.  She states she took Azo yesterday and felt like she was having an allergic reaction.  She states that she took Benadryl but that the throat pain has persisted.  The only antibiotic she can take due to her multiple allergies and Cipro    Past Medical History:  Diagnosis Date   Arthritis    Back pain    DDD (degenerative disc disease)    Heart murmur     There are no active problems to display for this patient.   History reviewed. No pertinent surgical history.  Prior to Admission medications   Medication Sig Start Date End Date Taking? Authorizing Provider  ALPRAZolam Duanne Moron) 0.25 MG tablet Take 0.25 mg by mouth daily as needed for anxiety.     [provider]  ciprofloxacin (CIPRO) 500 MG tablet Take 1 tablet (500 mg total) by mouth 2 (two) times daily. 04/30/19   [place], Linden Dolin, PA-C  EPINEPHrine (EPIPEN 2-PAK) 0.3 mg/0.3 mL IJ SOAJ injection Inject 0.3 mg into the muscle once.    [provider]  ibuprofen (ADVIL,MOTRIN) 200 MG tablet Take 800 mg by mouth 2 (two) times daily as needed for mild pain.     [provider]  meloxicam (MOBIC) 15 MG tablet Take 1 tablet (15 mg total) by mouth daily. Patient not taking: Reported on 08/07/2018 01/05/17   Hagler, Jami L, PA-C  oxyCODONE-acetaminophen  (PERCOCET/ROXICET) 5-325 MG tablet Take 1 tablet by mouth daily as needed for severe pain.  07/27/18   [provider]    Allergies Aleve cold & [pseudoephedrine-naproxen na er], Amoxicillin, Chloraseptic sore throat [acetaminophen], Clindamycin/lincomycin, Doxycycline, Flagyl [metronidazole hcl], Influenza vaccines, Neurontin [gabapentin], Nubain [nalbuphine hcl], Penicillins, Phenergan [promethazine hcl], Prednisone, Septra [bactrim], and Ultram [tramadol hcl]  No family history on file.  Social History Social History   Tobacco Use   Smoking status: Current Every Day Smoker    Packs/day: 0.25    Types: Cigarettes   Smokeless tobacco: Never Used  Substance Use Topics   Alcohol use: Yes    Comment: once every 6 months   Drug use: No    Review of Systems  Constitutional: Positive fever/chills, positive body aches Eyes: No visual changes. ENT: Positive sore throat. Respiratory: Denies cough Genitourinary: Some frequency and dysuria. Musculoskeletal: Negative for back pain. Skin: Negative for rash.    ____________________________________________   PHYSICAL EXAM:  VITAL SIGNS: ED Triage Vitals  Enc Vitals Group     BP 09/09/19 1722 (!) 170/91     Pulse Rate 09/09/19 1722 76     Resp 09/09/19 1722 16     Temp 09/09/19 1722 98.8 F (37.1 C)     Temp Source 09/09/19 1722 Oral     SpO2 09/09/19 1722 100 %     Weight 09/09/19 1723 280 lb (127 kg)     Height 09/09/19 1723 5'  9" (1.753 m)     Head Circumference --      Peak Flow --      Pain Score 09/09/19 1723 7     Pain Loc --      Pain Edu? --      Excl. in [place]? --     Constitutional: Alert and oriented. Well appearing and in no acute distress. Eyes: Conjunctivae are normal.  Head: Atraumatic. Nose: No congestion/rhinnorhea. Mouth/Throat: Mucous membranes are moist.  Throat appears normal Neck:  supple no lymphadenopathy noted Cardiovascular: Normal rate, regular rhythm. Heart sounds are  normal Respiratory: Normal respiratory effort.  No retractions, lungs c t a  Abd: soft nontender bs normal all 4 quad GU: deferred Musculoskeletal: FROM all extremities, warm and well perfused Neurologic:  Normal speech and language.  Skin:  Skin is warm, dry and intact. No rash noted. Psychiatric: Mood and affect are normal. Speech and behavior are normal.  ____________________________________________   LABS (all labs ordered are listed, but only abnormal results are displayed)  Labs Reviewed  URINALYSIS, COMPLETE (UACMP) WITH MICROSCOPIC - Abnormal; Notable for the following components:      Result Value   Color, Urine YELLOW (*)    APPearance CLOUDY (*)    All other components within normal limits  GROUP A STREP BY PCR  SARS CORONAVIRUS 2 (HOSPITAL ORDER, [facility])   ____________________________________________   ____________________________________________  RADIOLOGY    ____________________________________________   PROCEDURES  Procedure(s) performed: No  Procedures    ____________________________________________   INITIAL IMPRESSION / ASSESSMENT AND PLAN / ED COURSE  Pertinent labs & imaging results that were available during my care of the patient were reviewed by me and considered in my medical decision making (see chart for details).   Patient is 38 year old female presents emergency department complaining of questionable allergic reaction versus tonsillitis.  She states she has had body aches and sore throat.  She took an Azo yesterday for some UTI symptoms and thought she was having a reaction.  She states Benadryl did not help.  She denies any hives.  No trouble breathing.  Coworker tested is positive for COVID  Physical exam patient appears well, blood pressure is elevated but other vitals are within normal limits.  Throat is not red but the uvula is a little elongated.  No hives are noted.  Patient is flushed.  Remainder  the exam is unremarkable  Strep test, COVID test, UA    ----------------------------------------- 8:42 PM on 09/09/2019 -----------------------------------------  Strep test, COVID test, UA are all normal  Explained the findings to the patient.  Gave her a work note for today and tomorrow.  She is to drink plenty of water.  Take over-the-counter medications as needed.  Return if worsening.  She states she understands will comply.  She is discharged stable condition.   Ande Goewey was evaluated in Emergency Department on 09/09/2019 for the symptoms described in the history of present illness. She was evaluated in the context of the global COVID-19 pandemic, which necessitated consideration that the patient might be at risk for infection with the SARS-CoV-2 virus that causes COVID-19. Institutional protocols and algorithms that pertain to the evaluation of patients at risk for COVID-19 are in a state of rapid change based on information released by regulatory bodies including the CDC and federal and state organizations. These policies and algorithms were followed during the patient's care in the ED.   As part of my medical decision making, I  reviewed the following data within the [place] notes reviewed and incorporated, Labs reviewed strep test, UA, and coag test are all normal, Old chart reviewed, Notes from prior ED visits and [place] Controlled Substance Database  ____________________________________________   FINAL CLINICAL IMPRESSION(S) / ED DIAGNOSES  Final diagnoses:  Sore throat      NEW MEDICATIONS STARTED DURING THIS VISIT:  New Prescriptions   No medications on file     Note:  This document was prepared using Dragon voice recognition software and may include unintentional dictation errors.    Versie Starks, PA-C 09/09/19 2043    Lilia Pro., MD 09/10/19 [phone]

## 2019-09-13 ENCOUNTER — Emergency Department
Admission: EM | Admit: 2019-09-13 | Discharge: 2019-09-13 | Disposition: A | Discharge status: Home / Self Care | Payer: 59 | Attending: Emergency Medicine | Admitting: Emergency Medicine

## 2019-09-13 ENCOUNTER — Encounter: Payer: Self-pay | Admitting: Emergency Medicine

## 2019-09-13 ENCOUNTER — Other Ambulatory Visit: Payer: Self-pay

## 2019-09-13 DIAGNOSIS — F17210 Nicotine dependence, cigarettes, uncomplicated: Secondary | ICD-10-CM | POA: Diagnosis not present

## 2019-09-13 DIAGNOSIS — J029 Acute pharyngitis, unspecified: Secondary | ICD-10-CM | POA: Diagnosis not present

## 2019-09-13 DIAGNOSIS — Z79899 Other long term (current) drug therapy: Secondary | ICD-10-CM | POA: Insufficient documentation

## 2019-09-13 MED ORDER — CIPROFLOXACIN HCL 500 MG PO TABS: 500.0000 mg | ORAL_TABLET | Freq: Two times a day (BID) | ORAL | 0 refills | Status: DC

## 2019-09-13 MED ORDER — DEXAMETHASONE 10 MG/ML FOR PEDIATRIC ORAL USE
10.0000 mg | Freq: Once | INTRAMUSCULAR | Status: AC
  Administered 2019-09-13: 10 mg via ORAL
  Filled 2019-09-13: qty 1

## 2019-09-13 NOTE — ED Provider Notes (Signed)
[facility] Emergency Department Provider Note  ____________________________________________   First MD Initiated Contact with Patient 09/13/19 0401     (approximate)  I have reviewed the triage vital signs and the nursing notes.   HISTORY  Chief Complaint Sore Throat    HPI Sarah Steele is a 38 y.o. female with medical history as listed below who presents for evaluation of persistent sore throat.  She has had a sore throat for about 4 days and was seen here about 3 days ago.  She had a negative strep and COVID test at that time but said the pain persists and she has had multiple episodes of "tonsillitis" in the past that required antibiotics.  She says she has numerous medication allergies and the only antibiotic she can take is Cipro.  Due to the issues she has had with her tonsils in the past she would like to see an ENT but has not yet been able to do so.  She said that it hurts to swallow but she has no difficulty swallowing her own secretions or eating or drinking.  She has a normal voice.  She denies fever/chills, chest pain, cough, shortness of breath, nausea, vomiting, and abdominal pain.  Nothing particular makes his symptoms better.   No known contact with COVID-19 patients.        Past Medical History:  Diagnosis Date  . Arthritis   . Back pain   . DDD (degenerative disc disease)   . Heart murmur     There are no active problems to display for this patient.   History reviewed. No pertinent surgical history.  Prior to Admission medications   Medication Sig Start Date End Date Taking? Authorizing Provider  ALPRAZolam Duanne Moron) 0.25 MG tablet Take 0.25 mg by mouth daily as needed for anxiety.     [provider]  ciprofloxacin (CIPRO) 500 MG tablet Take 1 tablet (500 mg total) by mouth 2 (two) times daily. 09/13/19   Hinda Kehr, MD  EPINEPHrine (EPIPEN 2-PAK) 0.3 mg/0.3 mL IJ SOAJ injection Inject 0.3 mg into the muscle once.     [provider]  ibuprofen (ADVIL,MOTRIN) 200 MG tablet Take 800 mg by mouth 2 (two) times daily as needed for mild pain.     [provider]  meloxicam (MOBIC) 15 MG tablet Take 1 tablet (15 mg total) by mouth daily. Patient not taking: Reported on 08/07/2018 01/05/17   Hagler, Jami L, PA-C  oxyCODONE-acetaminophen (PERCOCET/ROXICET) 5-325 MG tablet Take 1 tablet by mouth daily as needed for severe pain.  07/27/18   [provider]    Allergies Aleve cold & [pseudoephedrine-naproxen na er], Amoxicillin, Chloraseptic sore throat [acetaminophen], Clindamycin/lincomycin, Doxycycline, Flagyl [metronidazole hcl], Influenza vaccines, Neurontin [gabapentin], Nubain [nalbuphine hcl], Penicillins, Phenergan [promethazine hcl], Prednisone, Septra [bactrim], and Ultram [tramadol hcl]  History reviewed. No pertinent family history.  Social History Social History   Tobacco Use  . Smoking status: Current Every Day Smoker    Packs/day: 0.25    Types: Cigarettes  . Smokeless tobacco: Never Used  Substance Use Topics  . Alcohol use: Yes    Comment: once every 6 months  . Drug use: No    Review of Systems Constitutional: No fever/chills Eyes: No visual changes. ENT: +sore throat. Cardiovascular: Denies chest pain. Respiratory: Denies shortness of breath. Gastrointestinal: No abdominal pain.  No nausea, no vomiting.   Integumentary: Negative for rash. Neurological: Negative for headaches, focal weakness or numbness.   ____________________________________________   PHYSICAL  EXAM:  VITAL SIGNS: ED Triage Vitals  Enc Vitals Group     BP 09/13/19 0238 (!) 150/85     Pulse Rate 09/13/19 0238 87     Resp 09/13/19 0238 17     Temp 09/13/19 0238 98.7 F (37.1 C)     Temp Source 09/13/19 0238 Oral     SpO2 09/13/19 0238 98 %     Weight 09/13/19 0240 127 kg (279 lb 15.8 oz)     Height 09/13/19 0240 1.753 m (5\' 9" )     Head Circumference --      Peak Flow --       Pain Score 09/13/19 0239 9     Pain Loc --      Pain Edu? --      Excl. in [place]? --     Constitutional: Alert and oriented.  Well-appearing and in no distress. Eyes: Conjunctivae are normal.  Head: Atraumatic. Nose: No congestion/rhinnorhea. Mouth/Throat: Mucous membranes are moist and nonerythematous.  No exudate on the tonsils.  1 small tonsil with is present on the left tonsil.  No petechiae on the palate. Neck: No stridor.  No meningeal signs.  No tenderness to manipulation of the larynx.  No cervical lymphadenopathy.  Normal voice, no hoarseness nor "hot potato" voice. Cardiovascular: Normal rate, regular rhythm. Good peripheral circulation. Respiratory: Normal respiratory effort.  No retractions. Neurologic:  Normal speech and language. No gross focal neurologic deficits are appreciated.  Skin:  Skin is warm, dry and intact. Psychiatric: Mood and affect are normal. Speech and behavior are normal.  ____________________________________________   LABS (all labs ordered are listed, but only abnormal results are displayed)  Labs Reviewed - No data to display ____________________________________________  EKG  None - EKG not ordered by ED physician ____________________________________________  RADIOLOGY I, Hinda Kehr, personally viewed and evaluated these images (plain radiographs) as part of my medical decision making, as well as reviewing the written report by the radiologist.  ED MD interpretation: No indication for imaging  Official radiology report(s): No results found.  ____________________________________________   PROCEDURES   Procedure(s) performed (including Critical Care):  Procedures   ____________________________________________   INITIAL IMPRESSION / MDM / [place] / ED COURSE  As part of my medical decision making, I reviewed the following data within the [facility] notes reviewed and incorporated, Old chart  reviewed, Notes from prior ED visits and [place] Controlled Substance Database   Well-appearing patient, normal vital signs, afebrile.  Reassuring physical exam with no sign of peritonsillar abscess, epiglottitis, retropharyngeal abscess, etc.  I discussed with the patient that I do not think this is bacterial pharyngitis and that she would not benefit from antibiotics.  She is quite concerned and convinced that she does need antibiotics (Cipro), and I even explained that I did not think Cipro was the appropriate antibiotic for this sort of infection even if it was bacterial.  However she feels that she will need to come back if she is not given antibiotics at this time.  Weighing the risks and benefits, I decided to give her a dose of Decadron 10 mg by mouth and I provided a prescription for Cipro but I encouraged her to wait a day or 2 to see if her symptoms improve before she starts taking the antibiotics.  I strongly encouraged her to follow-up with Dr. Pryor Ochoa with ENT and she says that she understands.  I also gave my usual customary return precautions.  I recognize  that she has a list of numerous anaphylactic reactions to medications including prednisone, but I think it is very unlikely she will exhibit an anaphylactic or any kind of allergic reaction to Decadron and I think the benefit is worth the risk.          ____________________________________________  FINAL CLINICAL IMPRESSION(S) / ED DIAGNOSES  Final diagnoses:  Sore throat     MEDICATIONS GIVEN DURING THIS VISIT:  Medications  dexamethasone (DECADRON) 10 MG/ML injection for Pediatric ORAL use 10 mg (10 mg Oral Given 09/13/19 0529)     ED Discharge Orders         Ordered    ciprofloxacin (CIPRO) 500 MG tablet  2 times daily     09/13/19 0510          *Please note:  Lanelle Hashemi was evaluated in Emergency Department on 09/13/2019 for the symptoms described in the history of present illness. She was evaluated in the context of  the global COVID-19 pandemic, which necessitated consideration that the patient might be at risk for infection with the SARS-CoV-2 virus that causes COVID-19. Institutional protocols and algorithms that pertain to the evaluation of patients at risk for COVID-19 are in a state of rapid change based on information released by regulatory bodies including the CDC and federal and state organizations. These policies and algorithms were followed during the patient's care in the ED.  Some ED evaluations and interventions may be delayed as a result of limited staffing during the pandemic.*  Note:  This document was prepared using Dragon voice recognition software and may include unintentional dictation errors.   Hinda Kehr, MD 09/13/19 [phone]

## 2019-09-13 NOTE — Discharge Instructions (Signed)
As we discussed, your tonsils actually do not appear acutely infected at this time, and I currently do not recommend the use of antibiotics.  However, based on the fact you have been through this several times in the past, I provided a prescription for Cipro, but I only recommend he use it if your symptoms are worsening after 1 to 2 days.  I recommend that you follow-up with Dr. Pryor Ochoa or 1 of his colleagues with ENT for further evaluation of your recurrent sore throat/tonsillitis episodes.  Return to the emergency department if you develop new or worsening symptoms that concern you.

## 2019-09-13 NOTE — ED Triage Notes (Addendum)
Pt reports sore throat since Wednesday; seen here Thursday for same; Strep and COVID were negative; pt says pain continues; swelling continues; feeling worse; has not checked temp but has felt flushed at times; can swallow but pain increases; pt talking in complete coherent sentences; pt has been taking ibuprofen and gargling with salt water with no improvement

## 2019-10-31 ENCOUNTER — Emergency Department
Admission: EM | Admit: 2019-10-31 | Discharge: 2019-10-31 | Disposition: A | Discharge status: Home / Self Care | Payer: 59 | Attending: Student | Admitting: Student

## 2019-10-31 DIAGNOSIS — R202 Paresthesia of skin: Secondary | ICD-10-CM | POA: Diagnosis not present

## 2019-10-31 DIAGNOSIS — F17210 Nicotine dependence, cigarettes, uncomplicated: Secondary | ICD-10-CM | POA: Insufficient documentation

## 2019-10-31 DIAGNOSIS — R200 Anesthesia of skin: Secondary | ICD-10-CM

## 2019-10-31 DIAGNOSIS — I493 Ventricular premature depolarization: Secondary | ICD-10-CM | POA: Insufficient documentation

## 2019-10-31 DIAGNOSIS — Z79899 Other long term (current) drug therapy: Secondary | ICD-10-CM | POA: Insufficient documentation

## 2019-10-31 LAB — COMPREHENSIVE METABOLIC PANEL
ALT: 11 U/L (ref 0–44)
AST: 15 U/L (ref 15–41)
Albumin: 3.7 g/dL (ref 3.5–5.0)
Alkaline Phosphatase: 71 U/L (ref 38–126)
Anion gap: 9 (ref 5–15)
BUN: 10 mg/dL (ref 6–20)
CO2: 25 mmol/L (ref 22–32)
Calcium: 8.5 mg/dL — ABNORMAL LOW (ref 8.9–10.3)
Chloride: 104 mmol/L (ref 98–111)
Creatinine, Ser: 0.69 mg/dL (ref 0.44–1.00)
GFR calc Af Amer: >60 mL/min (ref >60)
GFR calc non Af Amer: >60 mL/min (ref >60)
Glucose, Bld: 99 mg/dL (ref 70–99)
Potassium: 3.5 mmol/L (ref 3.5–5.1)
Sodium: 138 mmol/L (ref 135–145)
Total Bilirubin: 0.6 mg/dL (ref 0.3–1.2)
Total Protein: 6.5 g/dL (ref 6.5–8.1)

## 2019-10-31 LAB — CBC WITH DIFFERENTIAL/PLATELET
Abs Immature Granulocytes: 0.03 10*3/uL (ref 0.00–0.07)
Basophils Absolute: 0 10*3/uL (ref 0.0–0.1)
Basophils Relative: 0 %
Eosinophils Absolute: 0.1 10*3/uL (ref 0.0–0.5)
Eosinophils Relative: 1 %
HCT: 38.4 % (ref 36.0–46.0)
Hemoglobin: 12.4 g/dL (ref 12.0–15.0)
Immature Granulocytes: 0 %
Lymphocytes Relative: 25 %
Lymphs Abs: 2.1 10*3/uL (ref 0.7–4.0)
MCH: 27.9 pg (ref 26.0–34.0)
MCHC: 32.3 g/dL (ref 30.0–36.0)
MCV: 86.5 fL (ref 80.0–100.0)
Monocytes Absolute: 0.5 10*3/uL (ref 0.1–1.0)
Monocytes Relative: 6 %
Neutro Abs: 5.6 10*3/uL (ref 1.7–7.7)
Neutrophils Relative %: 68 %
Platelets: 245 10*3/uL (ref 150–400)
RBC: 4.44 MIL/uL (ref 3.87–5.11)
RDW: 13.4 % (ref 11.5–15.5)
WBC: 8.4 10*3/uL (ref 4.0–10.5)
nRBC: 0 % (ref 0.0–0.2)

## 2019-10-31 LAB — URINALYSIS, COMPLETE (UACMP) WITH MICROSCOPIC
Bacteria, UA: NONE SEEN
Bilirubin Urine: NEGATIVE
Glucose, UA: NEGATIVE mg/dL
Hgb urine dipstick: NEGATIVE
Ketones, ur: NEGATIVE mg/dL
Nitrite: NEGATIVE
Protein, ur: NEGATIVE mg/dL
Specific Gravity, Urine: 1.011 (ref 1.005–1.030)
pH: 7 (ref 5.0–8.0)

## 2019-10-31 LAB — TSH: TSH: 2.623 u[IU]/mL (ref 0.350–4.500)

## 2019-10-31 LAB — MAGNESIUM: Magnesium: 2.2 mg/dL (ref 1.7–2.4)

## 2019-10-31 LAB — T4, FREE: Free T4: 0.82 ng/dL (ref 0.61–1.12)

## 2019-10-31 LAB — PREGNANCY, URINE: Preg Test, Ur: NEGATIVE

## 2019-10-31 MED ORDER — SODIUM CHLORIDE 0.9 % IV BOLUS
1000.0000 mL | Freq: Once | INTRAVENOUS | Status: AC
  Administered 2019-10-31: 1000 mL via INTRAVENOUS

## 2019-10-31 MED ORDER — KETOROLAC TROMETHAMINE 15 MG/ML IJ SOLN
15.0000 mg | Freq: Once | INTRAMUSCULAR | Status: AC
  Administered 2019-10-31: 15 mg via INTRAVENOUS
  Filled 2019-10-31: qty 1

## 2019-10-31 NOTE — Discharge Instructions (Addendum)
Thank you for letting us take care of you in the emergency department today.   Please continue to take any regular, prescribed medications.   Please follow up with: - A primary care doctor to review your ER visit and follow up on your symptoms.  Information for one is below. - Your cardiologist at your scheduled appointment, please let them know about your frequent premature ventricular contractions  Please return to the ER for any new or worsening symptoms.

## 2019-10-31 NOTE — ED Provider Notes (Signed)
[facility] Emergency Department Provider Note  ____________________________________________   First MD Initiated Contact with Patient 10/31/19 [phone]     (approximate)  I have reviewed the triage vital signs and the nursing notes.  History  Chief Complaint Numbness    HPI Sarah Steele is a 38 y.o. female with history of arthritis, DDD, anxiety who presents for numbness and tingling to her face and bilateral upper extremities.  Patient states she got into a hot shower this morning after which she felt lightheaded.  Shortly after getting out of the shower she felt numbness and tingling sensation to her mouth, which then progressed to her upper extremities.  She felt like her bilateral hands cramped up as well.  This episode lasted a few minutes before spontaneously resolving.  She now reports a mild tension headache. She denies any associated weakness, speech changes, facial droop.  She denies any chest pain, difficulty breathing, or palpitations.  No associated loss of consciousness.  She denies any history of thyroid disease.  She does state that she has not eaten anything since about 5 PM last night, and does report that her shower temperature was quite hot.   Past Medical Hx Past Medical History:  Diagnosis Date  . Arthritis   . Back pain   . DDD (degenerative disc disease)   . Heart murmur     Problem List There are no active problems to display for this patient.   Past Surgical Hx History reviewed. No pertinent surgical history.  Medications Prior to Admission medications   Medication Sig Start Date End Date Taking? Authorizing Provider  ALPRAZolam Duanne Moron) 0.25 MG tablet Take 0.25 mg by mouth daily as needed for anxiety.     [provider]  ciprofloxacin (CIPRO) 500 MG tablet Take 1 tablet (500 mg total) by mouth 2 (two) times daily. 09/13/19   Hinda Kehr, MD  EPINEPHrine (EPIPEN 2-PAK) 0.3 mg/0.3 mL IJ SOAJ injection Inject 0.3 mg into  the muscle once.    [provider]  ibuprofen (ADVIL,MOTRIN) 200 MG tablet Take 800 mg by mouth 2 (two) times daily as needed for mild pain.     [provider]  meloxicam (MOBIC) 15 MG tablet Take 1 tablet (15 mg total) by mouth daily. Patient not taking: Reported on 08/07/2018 01/05/17   Hagler, Jami L, PA-C  oxyCODONE-acetaminophen (PERCOCET/ROXICET) 5-325 MG tablet Take 1 tablet by mouth daily as needed for severe pain.  07/27/18   [provider]    Allergies Aleve cold & [pseudoephedrine-naproxen na er], Amoxicillin, Chloraseptic sore throat [acetaminophen], Clindamycin/lincomycin, Doxycycline, Flagyl [metronidazole hcl], Influenza vaccines, Neurontin [gabapentin], Nubain [nalbuphine hcl], Penicillins, Phenergan [promethazine hcl], Prednisone, Septra [bactrim], and Ultram [tramadol hcl]  Family Hx No family history on file.  Social Hx Social History   Tobacco Use  . Smoking status: Current Every Day Smoker    Packs/day: 0.25    Types: Cigarettes  . Smokeless tobacco: Never Used  Substance Use Topics  . Alcohol use: Yes    Comment: once every 6 months  . Drug use: No     Review of Systems  Constitutional: Negative for fever, chills. Eyes: Negative for visual changes. ENT: Negative for sore throat. Cardiovascular: Negative for chest pain. Respiratory: Negative for shortness of breath. Gastrointestinal: Negative for nausea, vomiting.  Genitourinary: Negative for dysuria. Musculoskeletal: Negative for leg swelling. Skin: Negative for rash. Neurological: + lightheadedness, numbness, tingling   Physical Exam  Vital Signs: ED Triage Vitals  Enc Vitals Group  BP 10/31/19 0702 (!) 161/86     Pulse Rate 10/31/19 0702 92     Resp 10/31/19 0702 20     Temp 10/31/19 0702 97.7 F (36.5 C)     Temp Source 10/31/19 0702 Oral     SpO2 10/31/19 0702 96 %     Weight 10/31/19 0703 271 lb (122.9 kg)     Height 10/31/19 0703 5\' 9"  (1.753 m)     Head  Circumference --      Peak Flow --      Pain Score 10/31/19 0703 8     Pain Loc --      Pain Edu? --      Excl. in [place]? --     Constitutional: Alert and oriented.  Head: Normocephalic. Atraumatic. Eyes: Conjunctivae clear. Sclera anicteric. Nose: No congestion. No rhinorrhea. Mouth/Throat: Mucous membranes are moist.  Neck: No stridor.   Cardiovascular: Normal rate, regular rhythm. Suspect MVP murmur. Extremities well perfused. Respiratory: Normal respiratory effort.  Lungs CTAB. Gastrointestinal: Soft. Non-tender. Non-distended.  Musculoskeletal: No lower extremity edema. No deformities. Neurologic:  Normal speech and language. No gross focal neurologic deficits are appreciated. Alert and oriented.  Face symmetric.  Tongue midline.  Cranial nerves II through XII intact. UE and LE strength 5/5 and symmetric. UE and LE SILT.  Skin: Skin is warm, dry and intact. No rash noted. Psychiatric: Mood and affect are appropriate for situation.  EKG  Personally reviewed.   Rate: 80 Rhythm: sinus Axis: normal Intervals: WNL Frequent PVCs No STEMI  Intermittently with heavy PVC burden in trigeminy, bigeminy on telemetry.    Radiology  N/A   Procedures  Procedure(s) performed (including critical care):  Procedures   Initial Impression / Assessment and Plan / ED Course  38 y.o. female who presents to the ED for numbness, tingling to the mouth and arms, as noted above.  Ddx: hypoglycemia (hadn't eaten since 5 PM day prior), electrolyte abnormality (hypoK or hypoCa), vasodilatory response secondary to hot shower, thyroid disease, complex migraine, anxiety.  She has a normal neurological exam, do not suspect CVA/TIA.  Headache is not worst of life or thunderclap in description, do not suspect SAH.  Plan for labs, EKG, symptom control, reassess.  Labs essentially unremarkable. Minimally decreased calcium to 8.5.  Electrolytes without actionable derangements.  No evidence of urine  infection, negative urine pregnancy.  Thyroid studies within normal limits.  She does report improvement in her headache after fluids and Toradol.  Given negative work-up, patient is appropriate for discharge with outpatient work-up.  Given referral and information for PCP.  Did notify her of her frequent PVCs.  She states she already has an established cardiologist, who she will follow up with.  Discussed return precautions.  Patient voices understanding is comfortable to plan and discharge.   Final Clinical Impression(s) / ED Diagnosis  Final diagnoses:  Numbness and tingling  PVC (premature ventricular contraction)       Note:  This document was prepared using Dragon voice recognition software and may include unintentional dictation errors.   Lilia Pro., MD 10/31/19 [phone]

## 2019-10-31 NOTE — ED Triage Notes (Signed)
ADDENDUM: No facial droop noted. No slurred speech noted.

## 2019-10-31 NOTE — ED Notes (Signed)
MD made aware that patient had 3 beat run of Vtach, copy printed and placed in chart.

## 2019-10-31 NOTE — ED Triage Notes (Signed)
Patient to ED for numbness and tingling on her face and bilateral upper extremities. Started when she was in the shower. Got dizzy in the shower and when she got out her face "got numb" and she started shaking. Hands and fingers were tingling bilaterally. Has happened before but not to this degree.

## 2020-01-03 ENCOUNTER — Emergency Department: Payer: 59

## 2020-01-03 ENCOUNTER — Encounter: Payer: Self-pay | Admitting: Emergency Medicine

## 2020-01-03 ENCOUNTER — Emergency Department
Admission: EM | Admit: 2020-01-03 | Discharge: 2020-01-03 | Disposition: A | Discharge status: Home / Self Care | Payer: 59 | Attending: Emergency Medicine | Admitting: Emergency Medicine

## 2020-01-03 ENCOUNTER — Other Ambulatory Visit: Payer: Self-pay

## 2020-01-03 DIAGNOSIS — Z20822 Contact with and (suspected) exposure to covid-19: Secondary | ICD-10-CM | POA: Diagnosis not present

## 2020-01-03 DIAGNOSIS — F17210 Nicotine dependence, cigarettes, uncomplicated: Secondary | ICD-10-CM | POA: Diagnosis not present

## 2020-01-03 DIAGNOSIS — R590 Localized enlarged lymph nodes: Secondary | ICD-10-CM

## 2020-01-03 DIAGNOSIS — B349 Viral infection, unspecified: Secondary | ICD-10-CM | POA: Insufficient documentation

## 2020-01-03 DIAGNOSIS — J029 Acute pharyngitis, unspecified: Secondary | ICD-10-CM | POA: Diagnosis present

## 2020-01-03 LAB — CBC
HCT: 39.2 % (ref 36.0–46.0)
Hemoglobin: 12.8 g/dL (ref 12.0–15.0)
MCH: 28.3 pg (ref 26.0–34.0)
MCHC: 32.7 g/dL (ref 30.0–36.0)
MCV: 86.7 fL (ref 80.0–100.0)
Platelets: 286 10*3/uL (ref 150–400)
RBC: 4.52 MIL/uL (ref 3.87–5.11)
RDW: 13.5 % (ref 11.5–15.5)
WBC: 7.4 10*3/uL (ref 4.0–10.5)
nRBC: 0 % (ref 0.0–0.2)

## 2020-01-03 LAB — TSH: TSH: 3.196 u[IU]/mL (ref 0.350–4.500)

## 2020-01-03 LAB — GROUP A STREP BY PCR: Group A Strep by PCR: NOT DETECTED

## 2020-01-03 MED ORDER — KETOROLAC TROMETHAMINE 10 MG PO TABS: 10.0000 mg | ORAL_TABLET | Freq: Four times a day (QID) | ORAL | 0 refills | Status: DC | PRN

## 2020-01-03 MED ORDER — KETOROLAC TROMETHAMINE 60 MG/2ML IM SOLN
60.0000 mg | Freq: Once | INTRAMUSCULAR | Status: AC
  Administered 2020-01-03: 16:00:00 60 mg via INTRAMUSCULAR
  Filled 2020-01-03: qty 2

## 2020-01-03 MED ORDER — ONDANSETRON HCL 8 MG PO TABS: 8.0000 mg | ORAL_TABLET | Freq: Three times a day (TID) | ORAL | 0 refills | Status: DC | PRN

## 2020-01-03 NOTE — ED Notes (Signed)
See triage note  Presents with some nauseated  Swollen gland and fatigue  Afebrile on arrival

## 2020-01-03 NOTE — ED Provider Notes (Signed)
[facility] Emergency Department Provider Note   ____________________________________________   First MD Initiated Contact with Patient 01/03/20 1423     (approximate)  I have reviewed the triage vital signs and the nursing notes.   HISTORY  Chief Complaint COVID sxs    HPI Sarah Steele is a 39 y.o. female patient complain of body aches, sore throat, fatigue, nausea, headache, and posterior cervical enlarged lymph nodes.  Patient denies recent travel or known exposure to COVID-19.  Patient has a history of arthritis and chronic back pain.         Past Medical History:  Diagnosis Date  . Arthritis   . Back pain   . DDD (degenerative disc disease)   . Heart murmur     There are no problems to display for this patient.   History reviewed. No pertinent surgical history.  Prior to Admission medications   Medication Sig Start Date End Date Taking? Authorizing Provider  ALPRAZolam Duanne Moron) 0.25 MG tablet Take 0.25 mg by mouth daily as needed for anxiety.     [provider]  ciprofloxacin (CIPRO) 500 MG tablet Take 1 tablet (500 mg total) by mouth 2 (two) times daily. 09/13/19   Hinda Kehr, MD  EPINEPHrine (EPIPEN 2-PAK) 0.3 mg/0.3 mL IJ SOAJ injection Inject 0.3 mg into the muscle once.    [provider]  ibuprofen (ADVIL,MOTRIN) 200 MG tablet Take 800 mg by mouth 2 (two) times daily as needed for mild pain.     [provider]  ketorolac (TORADOL) 10 MG tablet Take 1 tablet (10 mg total) by mouth every 6 (six) hours as needed. 01/03/20   Sable Feil, PA-C  ondansetron (ZOFRAN) 8 MG tablet Take 1 tablet (8 mg total) by mouth every 8 (eight) hours as needed for nausea or vomiting. 01/03/20   Sable Feil, PA-C  oxyCODONE-acetaminophen (PERCOCET/ROXICET) 5-325 MG tablet Take 1 tablet by mouth daily as needed for severe pain.  07/27/18   [provider]    Allergies Aleve cold & [pseudoephedrine-naproxen na er],  Amoxicillin, Chloraseptic sore throat [acetaminophen], Clindamycin/lincomycin, Doxycycline, Flagyl [metronidazole hcl], Influenza vaccines, Neurontin [gabapentin], Nubain [nalbuphine hcl], Penicillins, Phenergan [promethazine hcl], Prednisone, Septra [bactrim], and Ultram [tramadol hcl]  No family history on file.  Social History Social History   Tobacco Use  . Smoking status: Current Every Day Smoker    Packs/day: 0.25    Types: Cigarettes  . Smokeless tobacco: Never Used  Substance Use Topics  . Alcohol use: Yes    Comment: once every 6 months  . Drug use: No    Review of Systems Constitutional: No fever/chills.  Fatigue and body aches.  Obesity. Eyes: No visual changes. ENT: Sore throat.   Cardiovascular: Denies chest pain. Respiratory: Denies shortness of breath. Gastrointestinal: No abdominal pain.  No nausea, no vomiting.  No diarrhea.  No constipation. Genitourinary: Negative for dysuria. Musculoskeletal: Negative for back pain. Skin: Negative for rash. Neurological: Positive for headaches, but denies focal weakness or numbness. Allergic/Immunilogical: NSAIDs, amoxicillin, Chloraseptic, clindamycin, doxycycline, Flagyl, Nubain, Phenergan, prednisone, Bactrim, and tramadol.  ____________________________________________   PHYSICAL EXAM:  VITAL SIGNS: ED Triage Vitals  Enc Vitals Group     BP 01/03/20 1420 127/80     Pulse Rate 01/03/20 1420 92     Resp 01/03/20 1420 17     Temp 01/03/20 1420 98.6 F (37 C)     Temp Source 01/03/20 1420 Oral     SpO2 01/03/20 1420 97 %  Weight 01/03/20 1419 273 lb (123.8 kg)     Height 01/03/20 1419 5\' 9"  (1.753 m)     Head Circumference --      Peak Flow --      Pain Score 01/03/20 1422 7     Pain Loc --      Pain Edu? --      Excl. in [place]? --    Constitutional: Alert and oriented. Well appearing and in no acute distress. Eyes: Conjunctivae are normal. PERRL. EOMI. Head: Atraumatic. Nose: Lateral maxillary guarding.   Edematous nasal turbinates clear rhinorrhea.   Mouth/Throat: Mucous membranes are moist.  Oropharynx non-erythematous. Neck: No stridor.   No cervical spine tenderness to palpation. Hematological/Lymphatic/Immunilogical: No cervical lymphadenopathy. Cardiovascular: Normal rate, regular rhythm. Grossly normal heart sounds.  Good peripheral circulation. Respiratory: Normal respiratory effort.  No retractions. Lungs CTAB. Gastrointestinal: Soft and nontender. No distention. No abdominal bruits. No CVA tenderness. Genitourinary: Deferred Skin:  Skin is warm, dry and intact. No rash noted. Psychiatric: Mood and affect are normal. Speech and behavior are normal.  ____________________________________________   LABS (all labs ordered are listed, but only abnormal results are displayed)  Labs Reviewed  GROUP A STREP BY PCR  SARS CORONAVIRUS 2 (TAT 6-24 HRS)  TSH  CBC   ____________________________________________  EKG   ____________________________________________  RADIOLOGY  ED MD interpretation:    Official radiology report(s): DG Chest Portable 1 View  Result Date: 01/03/2020 CLINICAL DATA:  Increasing fatigue EXAM: PORTABLE CHEST 1 VIEW COMPARISON:  07/17/2019 FINDINGS: The heart size and mediastinal contours are within normal limits. Both lungs are clear. The visualized skeletal structures are unremarkable. IMPRESSION: No acute abnormality of the lungs in AP portable projection. Electronically Signed   By: Eddie Candle M.D.   On: 01/03/2020 15:09    ____________________________________________   PROCEDURES  Procedure(s) performed (including Critical Care):  Procedures   ____________________________________________   INITIAL IMPRESSION / ASSESSMENT AND PLAN / ED COURSE  As part of my medical decision making, I reviewed the following data within the [place]     Patient presents with generalized body aches fatigue nausea and headache.  Patient  also states posterior cervical enlarged lymph nodes.  Discussed negative chest x-ray and lab results with patient.  Patient physical exam consistent with viral illness.  Patient given discharge care instruction advised take medication as directed.  Patient advised self quarantine pending results of COVID-19 test.    Sarah Steele was evaluated in Emergency Department on 01/03/2020 for the symptoms described in the history of present illness. She was evaluated in the context of the global COVID-19 pandemic, which necessitated consideration that the patient might be at risk for infection with the SARS-CoV-2 virus that causes COVID-19. Institutional protocols and algorithms that pertain to the evaluation of patients at risk for COVID-19 are in a state of rapid change based on information released by regulatory bodies including the CDC and federal and state organizations. These policies and algorithms were followed during the patient's care in the ED.       ____________________________________________   FINAL CLINICAL IMPRESSION(S) / ED DIAGNOSES  Final diagnoses:  Viral illness  Cervical adenopathy     ED Discharge Orders         Ordered    ondansetron (ZOFRAN) 8 MG tablet  Every 8 hours PRN,   Status:  Discontinued     01/03/20 1606    ketorolac (TORADOL) 10 MG tablet  Every 6 hours PRN,   Status:  Discontinued     01/03/20 1606    ondansetron (ZOFRAN) 8 MG tablet  Every 8 hours PRN     01/03/20 1608    ketorolac (TORADOL) 10 MG tablet  Every 6 hours PRN     01/03/20 1608           Note:  This document was prepared using Dragon voice recognition software and may include unintentional dictation errors.    Sable Feil, PA-C 01/03/20 1613    Earleen Newport, MD 01/04/20 1245

## 2020-01-03 NOTE — ED Triage Notes (Signed)
Pt to ED via POV c/o generalized body aches, fatigue, nausea, headache, and states that she feels like her lymph nodes are swollen. Pt is in NAD. Denies known COVID exposure

## 2020-01-04 LAB — SARS CORONAVIRUS 2 (TAT 6-24 HRS): SARS Coronavirus 2: NEGATIVE

## 2020-04-16 ENCOUNTER — Other Ambulatory Visit: Payer: Self-pay

## 2020-04-16 ENCOUNTER — Emergency Department: Payer: 59

## 2020-04-16 ENCOUNTER — Emergency Department
Admission: EM | Admit: 2020-04-16 | Discharge: 2020-04-16 | Disposition: A | Discharge status: Home / Self Care | Payer: 59 | Attending: Emergency Medicine | Admitting: Emergency Medicine

## 2020-04-16 DIAGNOSIS — S59902A Unspecified injury of left elbow, initial encounter: Secondary | ICD-10-CM | POA: Diagnosis not present

## 2020-04-16 DIAGNOSIS — M25512 Pain in left shoulder: Secondary | ICD-10-CM | POA: Diagnosis not present

## 2020-04-16 DIAGNOSIS — S6992XA Unspecified injury of left wrist, hand and finger(s), initial encounter: Secondary | ICD-10-CM | POA: Diagnosis present

## 2020-04-16 DIAGNOSIS — Y929 Unspecified place or not applicable: Secondary | ICD-10-CM | POA: Insufficient documentation

## 2020-04-16 DIAGNOSIS — Y999 Unspecified external cause status: Secondary | ICD-10-CM | POA: Insufficient documentation

## 2020-04-16 DIAGNOSIS — S4992XA Unspecified injury of left shoulder and upper arm, initial encounter: Secondary | ICD-10-CM | POA: Diagnosis not present

## 2020-04-16 DIAGNOSIS — W010XXA Fall on same level from slipping, tripping and stumbling without subsequent striking against object, initial encounter: Secondary | ICD-10-CM | POA: Diagnosis not present

## 2020-04-16 DIAGNOSIS — M25522 Pain in left elbow: Secondary | ICD-10-CM | POA: Diagnosis not present

## 2020-04-16 DIAGNOSIS — S63502A Unspecified sprain of left wrist, initial encounter: Secondary | ICD-10-CM | POA: Insufficient documentation

## 2020-04-16 DIAGNOSIS — Y93E1 Activity, personal bathing and showering: Secondary | ICD-10-CM | POA: Diagnosis not present

## 2020-04-16 DIAGNOSIS — F17210 Nicotine dependence, cigarettes, uncomplicated: Secondary | ICD-10-CM | POA: Insufficient documentation

## 2020-04-16 DIAGNOSIS — W19XXXA Unspecified fall, initial encounter: Secondary | ICD-10-CM

## 2020-04-16 MED ORDER — METHOCARBAMOL 500 MG PO TABS: 500.0000 mg | ORAL_TABLET | Freq: Four times a day (QID) | ORAL | 0 refills | Status: DC

## 2020-04-16 MED ORDER — OXYCODONE-ACETAMINOPHEN 5-325 MG PO TABS: 1.0000 | ORAL_TABLET | Freq: Four times a day (QID) | ORAL | 0 refills | Status: DC | PRN

## 2020-04-16 MED ORDER — MELOXICAM 15 MG PO TABS: 15.0000 mg | ORAL_TABLET | Freq: Every day | ORAL | 0 refills | Status: DC

## 2020-04-16 NOTE — ED Notes (Signed)
Pt states she fell in the shower and hurt her left shoulder and wrist when she tried to catch herself- describes the pain as sharp and shooting

## 2020-04-16 NOTE — ED Triage Notes (Signed)
Pt reports that she fell in shower and injured left shoulder and hand today - c/o pain radiating from hand into elbow then shoulder

## 2020-04-16 NOTE — ED Provider Notes (Signed)
[facility] Emergency Department Provider Note  ____________________________________________  Time seen: Approximately 4:38 PM  I have reviewed the triage vital signs and the nursing notes.   HISTORY  Chief Complaint Shoulder Pain    HPI Aayra Trach is a 39 y.o. female who presents the emergency department complaining of left wrist, elbow, shoulder pain after a fall in the shower.  Patient states that she was showering, slipped and fell.  She tried to catch herself on her outstretched hand/wrist but states that she is now experiencing pain to the wrist, elbow and shoulder.  She denied her head or lose consciousness.  No other injuries or complaints.  Patient with no alleviating medications prior to arrival.         Past Medical History:  Diagnosis Date  . Arthritis   . Back pain   . DDD (degenerative disc disease)   . Heart murmur     There are no problems to display for this patient.   History reviewed. No pertinent surgical history.  Prior to Admission medications   Medication Sig Start Date End Date Taking? Authorizing Provider  ALPRAZolam Duanne Moron) 0.25 MG tablet Take 0.25 mg by mouth daily as needed for anxiety.     [provider]  ciprofloxacin (CIPRO) 500 MG tablet Take 1 tablet (500 mg total) by mouth 2 (two) times daily. 09/13/19   Hinda Kehr, MD  EPINEPHrine (EPIPEN 2-PAK) 0.3 mg/0.3 mL IJ SOAJ injection Inject 0.3 mg into the muscle once.    [provider]  ibuprofen (ADVIL,MOTRIN) 200 MG tablet Take 800 mg by mouth 2 (two) times daily as needed for mild pain.     [provider]  ketorolac (TORADOL) 10 MG tablet Take 1 tablet (10 mg total) by mouth every 6 (six) hours as needed. 01/03/20   Sable Feil, PA-C  meloxicam (MOBIC) 15 MG tablet Take 1 tablet (15 mg total) by mouth daily. 04/16/20   Elery Cadenhead, Charline Bills, PA-C  methocarbamol (ROBAXIN) 500 MG tablet Take 1 tablet (500 mg total) by mouth 4 (four) times  daily. 04/16/20   Leilanny Fluitt, Charline Bills, PA-C  ondansetron (ZOFRAN) 8 MG tablet Take 1 tablet (8 mg total) by mouth every 8 (eight) hours as needed for nausea or vomiting. 01/03/20   Sable Feil, PA-C  oxyCODONE-acetaminophen (PERCOCET/ROXICET) 5-325 MG tablet Take 1 tablet by mouth daily as needed for severe pain.  07/27/18   [provider]  oxyCODONE-acetaminophen (PERCOCET/ROXICET) 5-325 MG tablet Take 1 tablet by mouth every 6 (six) hours as needed. 04/16/20   Egidio Lofgren, Charline Bills, PA-C    Allergies Aleve cold & [pseudoephedrine-naproxen na er], Amoxicillin, Chloraseptic sore throat [acetaminophen], Clindamycin/lincomycin, Doxycycline, Flagyl [metronidazole hcl], Influenza vaccines, Neurontin [gabapentin], Nubain [nalbuphine hcl], Penicillins, Phenergan [promethazine hcl], Prednisone, Septra [bactrim], and Ultram [tramadol hcl]  No family history on file.  Social History Social History   Tobacco Use  . Smoking status: Current Every Day Smoker    Packs/day: 0.25    Types: Cigarettes  . Smokeless tobacco: Never Used  Substance Use Topics  . Alcohol use: Yes    Comment: once every 6 months  . Drug use: No     Review of Systems  Constitutional: No fever/chills Eyes: No visual changes. No discharge ENT: No upper respiratory complaints. Cardiovascular: no chest pain. Respiratory: no cough. No SOB. Gastrointestinal: No abdominal pain.  No nausea, no vomiting.  No diarrhea.  No constipation. Musculoskeletal: Positive for left wrist, left elbow, left shoulder pain after  a fall. Skin: Negative for rash, abrasions, lacerations, ecchymosis. Neurological: Negative for headaches, focal weakness or numbness. 10-point ROS otherwise negative.  ____________________________________________   PHYSICAL EXAM:  VITAL SIGNS: ED Triage Vitals  Enc Vitals Group     BP 04/16/20 1547 (!) 139/99     Pulse Rate 04/16/20 1544 90     Resp 04/16/20 1544 16     Temp 04/16/20 1544 98.3 F  (36.8 C)     Temp Source 04/16/20 1544 Oral     SpO2 04/16/20 1544 96 %     Weight 04/16/20 1543 265 lb (120.2 kg)     Height 04/16/20 1543 5\' 9"  (1.753 m)     Head Circumference --      Peak Flow --      Pain Score 04/16/20 1547 7     Pain Loc --      Pain Edu? --      Excl. in [place]? --      Constitutional: Alert and oriented. Well appearing and in no acute distress. Eyes: Conjunctivae are normal. PERRL. EOMI. Head: Atraumatic. ENT:      Ears:       Nose: No congestion/rhinnorhea.      Mouth/Throat: Mucous membranes are moist.  Neck: No stridor.  No cervical spine tenderness to palpation.  Cardiovascular: Normal rate, regular rhythm. Normal S1 and S2.  Good peripheral circulation. Respiratory: Normal respiratory effort without tachypnea or retractions. Lungs CTAB. Good air entry to the bases with no decreased or absent breath sounds. Musculoskeletal: Full range of motion to all extremities. No gross deformities appreciated.  Visualization of the left upper extremity reveals no gross edema, abrasions, lacerations, deformity.  Patient is tender to palpation over the distal radius, with no palpable abnormality.  Good range of motion to the wrist.  Radial pulse, sensation intact all digits.  Patient is also tender to palpation of the olecranon process and lateral humeral head.  No other tenderness to palpation over the left upper extremity.  No palpable findings in these areas. Neurologic:  Normal speech and language. No gross focal neurologic deficits are appreciated.  Skin:  Skin is warm, dry and intact. No rash noted. Psychiatric: Mood and affect are normal. Speech and behavior are normal. Patient exhibits appropriate insight and judgement.   ____________________________________________   LABS (all labs ordered are listed, but only abnormal results are displayed)  Labs Reviewed - No data to  display ____________________________________________  EKG   ____________________________________________  RADIOLOGY I personally viewed and evaluated these images as part of my medical decision making, as well as reviewing the written report by the radiologist.  DG Elbow Complete Left  Result Date: 04/16/2020 CLINICAL DATA:  Acute LEFT elbow pain following fall today. Initial encounter. EXAM: LEFT ELBOW - COMPLETE 3+ VIEW COMPARISON:  None. FINDINGS: There is no evidence of fracture, dislocation, or joint effusion. There is no evidence of arthropathy or other focal bone abnormality. Soft tissues are unremarkable. IMPRESSION: Negative. Electronically Signed   By: Margarette Canada M.D.   On: 04/16/2020 17:08   DG Wrist Complete Left  Result Date: 04/16/2020 CLINICAL DATA:  Acute LEFT wrist pain following fall today. Initial encounter. EXAM: LEFT WRIST - COMPLETE 3+ VIEW COMPARISON:  None. FINDINGS: There is no evidence of fracture or dislocation. There is no evidence of arthropathy or other focal bone abnormality. Soft tissues are unremarkable. IMPRESSION: Negative. Electronically Signed   By: Margarette Canada M.D.   On: 04/16/2020 17:06   DG Shoulder Left  Result Date: 04/16/2020 CLINICAL DATA:  Acute LEFT shoulder pain following fall today. Initial encounter. EXAM: LEFT SHOULDER - 2+ VIEW COMPARISON:  None. FINDINGS: There is no evidence of fracture or dislocation. There is no evidence of arthropathy or other focal bone abnormality. Soft tissues are unremarkable. IMPRESSION: Negative. Electronically Signed   By: Margarette Canada M.D.   On: 04/16/2020 17:09    ____________________________________________    PROCEDURES  Procedure(s) performed:    Procedures    Medications - No data to display   ____________________________________________   INITIAL IMPRESSION / ASSESSMENT AND PLAN / ED COURSE  Pertinent labs & imaging results that were available during my care of the patient were reviewed  by me and considered in my medical decision making (see chart for details).  Review of the [place] CSRS was performed in accordance of the [place] prior to dispensing any controlled drugs.           Patient's diagnosis is consistent with fall resulting in wrist pain, pain of the left elbow and shoulder.  Patient fell in the shower.  Overall exam was reassuring.  Imaging reveals no acute fractures.  I will prescribe the patient pain medication, muscle relaxer, anti-inflammatory..  Follow-up primary care or orthopedics as needed.  Patient is given ED precautions to return to the ED for any worsening or new symptoms.     ____________________________________________  FINAL CLINICAL IMPRESSION(S) / ED DIAGNOSES  Final diagnoses:  Fall, initial encounter  Sprain of left wrist, initial encounter  Left elbow pain  Acute pain of left shoulder      NEW MEDICATIONS STARTED DURING THIS VISIT:  ED Discharge Orders         Ordered    meloxicam (MOBIC) 15 MG tablet  Daily     04/16/20 1827    methocarbamol (ROBAXIN) 500 MG tablet  4 times daily     04/16/20 1827    oxyCODONE-acetaminophen (PERCOCET/ROXICET) 5-325 MG tablet  Every 6 hours PRN     04/16/20 1827              This chart was dictated using voice recognition software/Dragon. Despite best efforts to proofread, errors can occur which can change the meaning. Any change was purely unintentional.    Darletta Moll, PA-C 04/16/20 Mariam Dollar, MD 04/17/20 [phone]

## 2020-06-04 ENCOUNTER — Other Ambulatory Visit: Payer: Self-pay

## 2020-06-04 DIAGNOSIS — Z79899 Other long term (current) drug therapy: Secondary | ICD-10-CM | POA: Insufficient documentation

## 2020-06-04 DIAGNOSIS — F17210 Nicotine dependence, cigarettes, uncomplicated: Secondary | ICD-10-CM | POA: Diagnosis not present

## 2020-06-04 DIAGNOSIS — R519 Headache, unspecified: Secondary | ICD-10-CM | POA: Diagnosis present

## 2020-06-04 DIAGNOSIS — G43901 Migraine, unspecified, not intractable, with status migrainosus: Secondary | ICD-10-CM | POA: Insufficient documentation

## 2020-06-04 NOTE — ED Triage Notes (Signed)
Patient with onset of a migraine yesterday. Points to her forehead and across as the area of the headache. States it is making her sick to her stomach. States ibuprofen is no help. Ice pack to head is a little help Patient states she usually has to come and "get a shot." States photophobia and phonophobia is present.

## 2020-06-05 ENCOUNTER — Emergency Department: Payer: 59

## 2020-06-05 ENCOUNTER — Emergency Department
Admission: EM | Admit: 2020-06-05 | Discharge: 2020-06-05 | Disposition: A | Discharge status: Home / Self Care | Payer: 59 | Attending: Emergency Medicine | Admitting: Emergency Medicine

## 2020-06-05 DIAGNOSIS — G43901 Migraine, unspecified, not intractable, with status migrainosus: Secondary | ICD-10-CM

## 2020-06-05 MED ORDER — DROPERIDOL 2.5 MG/ML IJ SOLN
2.5000 mg | Freq: Once | INTRAMUSCULAR | Status: DC
  Filled 2020-06-05: qty 2

## 2020-06-05 MED ORDER — DROPERIDOL 2.5 MG/ML IJ SOLN
2.5000 mg | Freq: Once | INTRAMUSCULAR | Status: AC
  Administered 2020-06-05: 2.5 mg via INTRAMUSCULAR

## 2020-06-05 MED ORDER — KETOROLAC TROMETHAMINE 30 MG/ML IJ SOLN
30.0000 mg | Freq: Once | INTRAMUSCULAR | Status: AC
  Administered 2020-06-05: 30 mg via INTRAMUSCULAR
  Filled 2020-06-05: qty 1

## 2020-06-05 MED ORDER — KETOROLAC TROMETHAMINE 30 MG/ML IJ SOLN: 15.0000 mg | Freq: Once | INTRAMUSCULAR | Status: DC

## 2020-06-05 MED ORDER — LACTATED RINGERS IV BOLUS: 1000.0000 mL | Freq: Once | INTRAVENOUS | Status: DC

## 2020-06-05 NOTE — ED Provider Notes (Signed)
[facility] Emergency Department Provider Note  ____________________________________________  Time seen: Approximately 4:53 AM  I have reviewed the triage vital signs and the nursing notes.   HISTORY  Chief Complaint Migraine   HPI Sarah Steele is a 39 y.o. female with a history of arthritis, back pain, DDD who presents for evaluation of a headache.  Patient reports that she has a history of frequent headaches but only had a headache this bad once before several years ago which required visit to the emergency room.  She has not been diagnosed with migraines.  She does report some visual floaters that preceding her headaches.  This one started 2 days ago.  The headache is generalized  and currently 10 out of 10.  She has photophobia and phonophobia, nausea, and blurry vision associated with it.  This is similar to her prior headaches but more severe.  No trauma.  She is not on blood thinners.  No neck stiffness or fever.  She took some ibuprofen at home with no significant relief.  Past Medical History:  Diagnosis Date  . Arthritis   . Back pain   . DDD (degenerative disc disease)   . Heart murmur      Prior to Admission medications   Medication Sig Start Date End Date Taking? Authorizing Provider  ALPRAZolam Duanne Moron) 0.25 MG tablet Take 0.25 mg by mouth daily as needed for anxiety.     [provider]  ciprofloxacin (CIPRO) 500 MG tablet Take 1 tablet (500 mg total) by mouth 2 (two) times daily. 09/13/19   Hinda Kehr, MD  EPINEPHrine (EPIPEN 2-PAK) 0.3 mg/0.3 mL IJ SOAJ injection Inject 0.3 mg into the muscle once.    [provider]  ibuprofen (ADVIL,MOTRIN) 200 MG tablet Take 800 mg by mouth 2 (two) times daily as needed for mild pain.     [provider]  ketorolac (TORADOL) 10 MG tablet Take 1 tablet (10 mg total) by mouth every 6 (six) hours as needed. 01/03/20   Sable Feil, PA-C  meloxicam (MOBIC) 15 MG tablet Take 1  tablet (15 mg total) by mouth daily. 04/16/20   Cuthriell, Charline Bills, PA-C  methocarbamol (ROBAXIN) 500 MG tablet Take 1 tablet (500 mg total) by mouth 4 (four) times daily. 04/16/20   Cuthriell, Charline Bills, PA-C  ondansetron (ZOFRAN) 8 MG tablet Take 1 tablet (8 mg total) by mouth every 8 (eight) hours as needed for nausea or vomiting. 01/03/20   Sable Feil, PA-C  oxyCODONE-acetaminophen (PERCOCET/ROXICET) 5-325 MG tablet Take 1 tablet by mouth daily as needed for severe pain.  07/27/18   [provider]  oxyCODONE-acetaminophen (PERCOCET/ROXICET) 5-325 MG tablet Take 1 tablet by mouth every 6 (six) hours as needed. 04/16/20   Cuthriell, Charline Bills, PA-C    Allergies Aleve cold & [pseudoephedrine-naproxen na er], Amoxicillin, Chloraseptic sore throat [acetaminophen], Clindamycin/lincomycin, Doxycycline, Flagyl [metronidazole hcl], Influenza vaccines, Neurontin [gabapentin], Nubain [nalbuphine hcl], Penicillins, Phenergan [promethazine hcl], Prednisone, Septra [bactrim], and Ultram [tramadol hcl]  No family history on file.  Social History Social History   Tobacco Use  . Smoking status: Current Every Day Smoker    Packs/day: 0.25    Types: Cigarettes  . Smokeless tobacco: Never Used  Vaping Use  . Vaping Use: Never used  Substance Use Topics  . Alcohol use: Yes    Comment: once every 6 months  . Drug use: No    Review of Systems  Constitutional: Negative for fever. Eyes: Negative for  visual changes. ENT: Negative for sore throat. Neck: No neck pain  Cardiovascular: Negative for chest pain. Respiratory: Negative for shortness of breath. Gastrointestinal: Negative for abdominal pain, vomiting or diarrhea. Genitourinary: Negative for dysuria. Musculoskeletal: Negative for back pain. Skin: Negative for rash. Neurological: Negative for weakness or numbness. + HA Psych: No SI or HI  ____________________________________________   PHYSICAL EXAM:  VITAL SIGNS: ED  Triage Vitals  Enc Vitals Group     BP 06/04/20 2214 123/69     Pulse Rate 06/04/20 2214 88     Resp 06/04/20 2214 18     Temp 06/04/20 2214 99.5 F (37.5 C)     Temp Source 06/04/20 2214 Oral     SpO2 06/04/20 2214 95 %     Weight 06/04/20 2215 270 lb (122.5 kg)     Height 06/04/20 2215 5\' 9"  (1.753 m)     Head Circumference --      Peak Flow --      Pain Score 06/04/20 2215 10     Pain Loc --      Pain Edu? --      Excl. in [place]? --     Constitutional: Alert and oriented. Well appearing and in no apparent distress. HEENT:      Head: Normocephalic and atraumatic.         Eyes: Conjunctivae are normal. Sclera is non-icteric.       Mouth/Throat: Mucous membranes are moist.       Neck: Supple with no signs of meningismus. Cardiovascular: Regular rate and rhythm. No murmurs, gallops, or rubs. Respiratory: Normal respiratory effort. Lungs are clear to auscultation bilaterally. No wheezes, crackles, or rhonchi.  Gastrointestinal: Soft, non tender. Musculoskeletal: Nontender with normal range of motion in all extremities. No edema, cyanosis, or erythema of extremities. Neurologic: Normal speech and language. Face is symmetric.  Intact strength and and sensation x4, pupils are equal round and reactive, extraocular movements are intact, normal visual fields, normal gait, no pronator drift, no dysmetria Skin: Skin is warm, dry and intact. No rash noted. Psychiatric: Mood and affect are normal. Speech and behavior are normal.  ____________________________________________   LABS (all labs ordered are listed, but only abnormal results are displayed)  Labs Reviewed - No data to display ____________________________________________  EKG  none  ____________________________________________  RADIOLOGY  I have personally reviewed the images performed during this visit and I agree with the Radiologist's read.   Interpretation by Radiologist:  CT Head Wo Contrast  Result Date:  06/05/2020 CLINICAL DATA:  Acute headache with normal neuro exam EXAM: CT HEAD WITHOUT CONTRAST TECHNIQUE: Contiguous axial images were obtained from the base of the skull through the vertex without intravenous contrast. COMPARISON:  None. FINDINGS: Brain: No evidence of acute infarction, hemorrhage, hydrocephalus, extra-axial collection or mass lesion/mass effect. Vascular: No hyperdense vessel or unexpected calcification. Skull: Normal. Negative for fracture or focal lesion. Sinuses/Orbits: No acute finding. IMPRESSION: Negative head CT. Electronically Signed   By: Monte Fantasia M.D.   On: 06/05/2020 05:43      ____________________________________________   PROCEDURES  Procedure(s) performed: yes .1-3 Lead EKG Interpretation Performed by: Rudene Re, MD Authorized by: Rudene Re, MD     Interpretation: normal     ECG rate assessment: normal     Rhythm: sinus rhythm     Ectopy: none     Critical Care performed:  None ____________________________________________   INITIAL IMPRESSION / ASSESSMENT AND PLAN / ED COURSE   39 y.o. female with  a history of arthritis, back pain, DDD who presents for evaluation of a headache.  Low suspicion for more serious or life threatening etiology of HA based on history and exam. No sudden onset thunderclap HA, onset with exertion, vomiting, focal neurologic deficits, to suggest increased risk of subarachnoid hemorrhage. No fever, neck pain, neck stiffness, or meningismus on exam to suggest meningitis. No fevers, altered mental status, unusual behavior to suggest encephalitis. No focal neurologic deficits by history or exam and no recent vaccination to suggest central venous thrombosis. No constitutional symptoms including fever, fatigue, weight loss, temporal scalp tenderness, jaw claudication, visual loss, to suggest temporal arteritis. No immunocompromise to suggest increased risk for intracranial infectious disease. No visual changes or  findings on ocular exam to suggest acute angle closure glaucoma. No reports of toxic exposures including carbon monoxide or other household members with similar symptoms.  CT head showed no acute abnormalities, confirmed by radiology. Will treat with migraine cocktail including toradol, IV fluids, droperidol for nausea.  Old medical records reviewed.  Patient placed on telemetry for close monitoring.  _________________________ 6:34 AM on 06/05/2020 -----------------------------------------  Headache has resolved.  Patient feels markedly improved.  At this time will discharge home with follow-up with PCP.  Discussed my standard return precautions.    _____________________________________________ Please note:  Patient was evaluated in Emergency Department today for the symptoms described in the history of present illness. Patient was evaluated in the context of the global COVID-19 pandemic, which necessitated consideration that the patient might be at risk for infection with the SARS-CoV-2 virus that causes COVID-19. Institutional protocols and algorithms that pertain to the evaluation of patients at risk for COVID-19 are in a state of rapid change based on information released by regulatory bodies including the CDC and federal and state organizations. These policies and algorithms were followed during the patient's care in the ED.  Some ED evaluations and interventions may be delayed as a result of limited staffing during the pandemic.   [place] Controlled Substance Database was reviewed by me. ____________________________________________   FINAL CLINICAL IMPRESSION(S) / ED DIAGNOSES   Final diagnoses:  Migraine with status migrainosus, not intractable, unspecified migraine type      NEW MEDICATIONS STARTED DURING THIS VISIT:  ED Discharge Orders    None       Note:  This document was prepared using Dragon voice recognition software and may include unintentional dictation errors.      Alfred Levins, [place], MD 06/05/20 [phone]

## 2020-06-05 NOTE — ED Notes (Signed)
E signature pad not working at this time.

## 2020-08-27 ENCOUNTER — Ambulatory Visit (HOSPITAL_COMMUNITY)
Admission: EM | Admit: 2020-08-27 | Discharge: 2020-08-27 | Disposition: A | Discharge status: Home / Self Care | Payer: 59 | Attending: Emergency Medicine | Admitting: Emergency Medicine

## 2020-08-27 ENCOUNTER — Other Ambulatory Visit: Payer: Self-pay

## 2020-08-27 ENCOUNTER — Encounter (HOSPITAL_COMMUNITY): Payer: Self-pay

## 2020-08-27 DIAGNOSIS — Z20822 Contact with and (suspected) exposure to covid-19: Secondary | ICD-10-CM | POA: Insufficient documentation

## 2020-08-27 DIAGNOSIS — J069 Acute upper respiratory infection, unspecified: Secondary | ICD-10-CM | POA: Diagnosis present

## 2020-08-27 MED ORDER — ONDANSETRON HCL 4 MG PO TABS: 4.0000 mg | ORAL_TABLET | Freq: Three times a day (TID) | ORAL | 0 refills | Status: DC | PRN

## 2020-08-27 NOTE — Discharge Instructions (Signed)
Push fluids to ensure adequate hydration and keep secretions thin.  Tylenol and/or ibuprofen as needed for pain or fevers.  Decongestants or over the counter medications as needed for symptoms.  Zofran every 8 hours as needed for nausea or vomiting.   Small frequent sips of fluids- Pedialyte, Gatorade, water, broth- to maintain hydration.   Self isolate until covid results are back and negative.  Will notify you by phone of any positive findings. Your negative results will be sent through your MyChart.

## 2020-08-27 NOTE — ED Triage Notes (Signed)
Pt c/o congestion, HA, runny nose, sore throat, bilateral ear pain for approx 2 days. Today change in taste, increased fatigue, nausea with dry heaves and some dizziness.  States her step-dad tested positive for COVID on Wednesday and pt has had contact with him prior. Denies fever, diarrhea, loss of smell, SOB.

## 2020-08-27 NOTE — ED Provider Notes (Signed)
[name]    CSN: [id number] Arrival date & time: 08/27/20  1649      History   Chief Complaint Chief Complaint  Patient presents with   Nasal Congestion    HPI Sarah Steele is a 39 y.o. female.   Sarah Steele presents with complaints of headache, body aches, dizziness, nausea, dry heaving. Some cough intermittently. Nasal drainage. Diarrhea. Sore throat. Ear pain bilaterally. No known fevers. Change in sense of taste. Exposed to covid by her step dad. No history of covid-19 and has not received vaccination. Took ibuprofen which does help some.     ROS per HPI, negative if not otherwise mentioned.      Past Medical History:  Diagnosis Date   Arthritis    Back pain    DDD (degenerative disc disease)    Heart murmur     There are no problems to display for this patient.   History reviewed. No pertinent surgical history.  OB History   No obstetric history on file.      Home Medications    Prior to Admission medications   Medication Sig Start Date End Date Taking? Authorizing Provider  ibuprofen (ADVIL,MOTRIN) 200 MG tablet Take 800 mg by mouth 2 (two) times daily as needed for mild pain.    Yes [provider]  oxyCODONE-acetaminophen (PERCOCET/ROXICET) 5-325 MG tablet Take 1 tablet by mouth every 6 (six) hours as needed. 04/16/20  Yes Cuthriell, Charline Bills, PA-C  tiZANidine (ZANAFLEX) 2 MG tablet Take 2 mg by mouth at bedtime as needed. 07/24/20  Yes [provider]  ALPRAZolam Duanne Moron) 0.25 MG tablet Take 0.25 mg by mouth daily as needed for anxiety.     [provider]  ciprofloxacin (CIPRO) 500 MG tablet Take 1 tablet (500 mg total) by mouth 2 (two) times daily. 09/13/19   Hinda Kehr, MD  EPINEPHrine (EPIPEN 2-PAK) 0.3 mg/0.3 mL IJ SOAJ injection Inject 0.3 mg into the muscle once.    [provider]  ketorolac (TORADOL) 10 MG tablet Take 1 tablet (10 mg total) by mouth every 6 (six) hours as needed. 01/03/20    Sable Feil, PA-C  meloxicam (MOBIC) 15 MG tablet Take 1 tablet (15 mg total) by mouth daily. 04/16/20   Cuthriell, Charline Bills, PA-C  methocarbamol (ROBAXIN) 500 MG tablet Take 1 tablet (500 mg total) by mouth 4 (four) times daily. 04/16/20   Cuthriell, Charline Bills, PA-C  ondansetron (ZOFRAN) 4 MG tablet Take 1 tablet (4 mg total) by mouth every 8 (eight) hours as needed for nausea or vomiting. 08/27/20   Zigmund Gottron, NP  oxyCODONE-acetaminophen (PERCOCET/ROXICET) 5-325 MG tablet Take 1 tablet by mouth daily as needed for severe pain.  07/27/18   [provider]    Family History History reviewed. No pertinent family history.  Social History Social History   Tobacco Use   Smoking status: Current Every Day Smoker    Packs/day: 0.25    Types: Cigarettes   Smokeless tobacco: Never Used  Vaping Use   Vaping Use: Never used  Substance Use Topics   Alcohol use: Yes    Comment: once every 6 months   Drug use: No     Allergies   Aleve cold & [pseudoephedrine-naproxen na er], Amoxicillin, Chloraseptic sore throat [acetaminophen], Clindamycin/lincomycin, Doxycycline, Flagyl [metronidazole hcl], Influenza vaccines, Neurontin [gabapentin], Nubain [nalbuphine hcl], Penicillins, Phenergan [promethazine hcl], Prednisone, Septra [bactrim], and Ultram [tramadol hcl]   Review of Systems Review of Systems   Physical  Exam Triage Vital Signs ED Triage Vitals  Enc Vitals Group     BP 08/27/20 1827 (!) 159/97     Pulse Rate 08/27/20 1827 82     Resp 08/27/20 1827 18     Temp 08/27/20 1827 98.3 F (36.8 C)     Temp Source 08/27/20 1827 Oral     SpO2 08/27/20 1827 99 %     Weight --      Height --      Head Circumference --      Peak Flow --      Pain Score 08/27/20 1825 7     Pain Loc --      Pain Edu? --      Excl. in [place]? --    No data found.  Updated Vital Signs BP (!) 159/97 (BP Location: Right Arm)    Pulse 82    Temp 98.3 F (36.8 C) (Oral)    Resp 18    LMP  07/26/2020 (Approximate)    SpO2 99%   Visual Acuity Right Eye Distance:   Left Eye Distance:   Bilateral Distance:    Right Eye Near:   Left Eye Near:    Bilateral Near:     Physical Exam Constitutional:      General: She is not in acute distress.    Appearance: She is well-developed.  Cardiovascular:     Rate and Rhythm: Normal rate.  Pulmonary:     Effort: Pulmonary effort is normal.  Skin:    General: Skin is warm and dry.  Neurological:     Mental Status: She is alert and oriented to person, place, and time.      UC Treatments / Results  Labs (all labs ordered are listed, but only abnormal results are displayed) Labs Reviewed  SARS CORONAVIRUS 2 (TAT 6-24 HRS)    EKG   Radiology No results found.  Procedures Procedures (including critical care time)  Medications Ordered in UC Medications - No data to display  Initial Impression / Assessment and Plan / UC Course  I have reviewed the triage vital signs and the nursing notes.  Pertinent labs & imaging results that were available during my care of the patient were reviewed by me and considered in my medical decision making (see chart for details).     Non toxic. Benign physical exam.  No work of breathing. Vitals stable. History and physical consistent with viral illness.  Supportive cares recommended. Covid testing pending and isolation instructions provided.  Return precautions provided. Patient verbalized understanding and agreeable to plan.    Final Clinical Impressions(s) / UC Diagnoses   Final diagnoses:  Upper respiratory tract infection, unspecified type  Exposure to COVID-19 virus     Discharge Instructions     Push fluids to ensure adequate hydration and keep secretions thin.  Tylenol and/or ibuprofen as needed for pain or fevers.  Decongestants or over the counter medications as needed for symptoms.  Zofran every 8 hours as needed for nausea or vomiting.   Small frequent sips of fluids-  Pedialyte, Gatorade, water, broth- to maintain hydration.   Self isolate until covid results are back and negative.  Will notify you by phone of any positive findings. Your negative results will be sent through your MyChart.         ED Prescriptions    Medication Sig Dispense Auth. Provider   ondansetron (ZOFRAN) 4 MG tablet Take 1 tablet (4 mg total) by mouth every 8 (eight)  hours as needed for nausea or vomiting. 10 tablet Zigmund Gottron, NP     PDMP not reviewed this encounter.   Zigmund Gottron, NP 08/28/20 1642

## 2020-08-28 LAB — SARS CORONAVIRUS 2 (TAT 6-24 HRS): SARS Coronavirus 2: NEGATIVE

## 2021-02-26 ENCOUNTER — Emergency Department: Payer: 59

## 2021-02-26 ENCOUNTER — Emergency Department
Admission: EM | Admit: 2021-02-26 | Discharge: 2021-02-26 | Disposition: A | Discharge status: Home / Self Care | Payer: 59 | Attending: Emergency Medicine | Admitting: Emergency Medicine

## 2021-02-26 ENCOUNTER — Other Ambulatory Visit: Payer: Self-pay

## 2021-02-26 ENCOUNTER — Encounter: Payer: Self-pay | Admitting: Emergency Medicine

## 2021-02-26 DIAGNOSIS — R0789 Other chest pain: Secondary | ICD-10-CM | POA: Diagnosis present

## 2021-02-26 DIAGNOSIS — R079 Chest pain, unspecified: Secondary | ICD-10-CM

## 2021-02-26 DIAGNOSIS — R059 Cough, unspecified: Secondary | ICD-10-CM | POA: Diagnosis not present

## 2021-02-26 DIAGNOSIS — R002 Palpitations: Secondary | ICD-10-CM | POA: Diagnosis not present

## 2021-02-26 DIAGNOSIS — R0602 Shortness of breath: Secondary | ICD-10-CM | POA: Insufficient documentation

## 2021-02-26 DIAGNOSIS — F17210 Nicotine dependence, cigarettes, uncomplicated: Secondary | ICD-10-CM | POA: Diagnosis not present

## 2021-02-26 LAB — CBC
HCT: 40.2 % (ref 36.0–46.0)
Hemoglobin: 13.1 g/dL (ref 12.0–15.0)
MCH: 28.9 pg (ref 26.0–34.0)
MCHC: 32.6 g/dL (ref 30.0–36.0)
MCV: 88.7 fL (ref 80.0–100.0)
Platelets: 319 10*3/uL (ref 150–400)
RBC: 4.53 MIL/uL (ref 3.87–5.11)
RDW: 13.9 % (ref 11.5–15.5)
WBC: 6.1 10*3/uL (ref 4.0–10.5)
nRBC: 0 % (ref 0.0–0.2)

## 2021-02-26 LAB — BASIC METABOLIC PANEL
Anion gap: 9 (ref 5–15)
BUN: 12 mg/dL (ref 6–20)
CO2: 24 mmol/L (ref 22–32)
Calcium: 9 mg/dL (ref 8.9–10.3)
Chloride: 109 mmol/L (ref 98–111)
Creatinine, Ser: 0.76 mg/dL (ref 0.44–1.00)
GFR, Estimated: >60 mL/min (ref >60)
Glucose, Bld: 102 mg/dL — ABNORMAL HIGH (ref 70–99)
Potassium: 3.6 mmol/L (ref 3.5–5.1)
Sodium: 142 mmol/L (ref 135–145)

## 2021-02-26 LAB — D-DIMER, QUANTITATIVE: D-Dimer, Quant: 0.28 ug/mL-FEU (ref 0.00–0.50)

## 2021-02-26 LAB — TROPONIN I (HIGH SENSITIVITY): Troponin I (High Sensitivity): 3 ng/L (ref <18)

## 2021-02-26 NOTE — ED Triage Notes (Signed)
C/O pain to chest and upper back.  Also feeling palpitations x 1 hour.  AAOx3.  Skin warm and dry. NAD.  No SOB/ DOE

## 2021-02-26 NOTE — ED Notes (Signed)
See triage note  Presents with some chest and upper back pain  Describes as pressure  States sx's increased with inspiration  No fever or cough

## 2021-02-26 NOTE — ED Provider Notes (Signed)
[facility] Emergency Department Provider Note  ____________________________________________   Event Date/Time   First MD Initiated Contact with Patient 02/26/21 1504     (approximate)  I have reviewed the triage vital signs and the nursing notes.   HISTORY  Chief Complaint Chest Pain (SOB)   HPI Sarah Steele is a 40 y.o. female with a past medical history of arthritis and chronic back pain currently managed by pain clinic on daily opioids and heart murmur who presents for assessment of some chest pressure rating to her left shoulder associate with palpitations.  Patient also has mild nonproductive cough.  She states she initially noticed some chest discomfort on 3 4 days ago but it felt like got worse today.  No headache, earache, sore throat, fevers, vomiting, diarrhea, dysuria, rash, extremity pain or any other acute sick symptoms.  Endorses tobacco abuse but denies illicit drug use or regular EtOH use.  No clear alleviating aggravating factors.  No prior similar episodes.         Past Medical History:  Diagnosis Date  . Arthritis   . Back pain   . DDD (degenerative disc disease)   . Heart murmur     There are no problems to display for this patient.   History reviewed. No pertinent surgical history.  Prior to Admission medications   Medication Sig Start Date End Date Taking? Authorizing Provider  ALPRAZolam Duanne Moron) 0.25 MG tablet Take 0.25 mg by mouth daily as needed for anxiety.     [provider]  EPINEPHrine (EPIPEN 2-PAK) 0.3 mg/0.3 mL IJ SOAJ injection Inject 0.3 mg into the muscle once.    [provider]  tiZANidine (ZANAFLEX) 2 MG tablet Take 2 mg by mouth at bedtime as needed. 07/24/20   [provider]    Allergies Aleve cold & [pseudoephedrine-naproxen na er], Amoxicillin, Chloraseptic sore throat [acetaminophen], Clindamycin/lincomycin, Doxycycline, Flagyl [metronidazole hcl], Influenza vaccines, Neurontin  [gabapentin], Nubain [nalbuphine hcl], Penicillins, Phenergan [promethazine hcl], Prednisone, Septra [bactrim], and Ultram [tramadol hcl]  No family history on file.  Social History Social History   Tobacco Use  . Smoking status: Current Every Day Smoker    Packs/day: 0.25    Types: Cigarettes  . Smokeless tobacco: Never Used  Vaping Use  . Vaping Use: Never used  Substance Use Topics  . Alcohol use: Yes    Comment: once every 6 months  . Drug use: No    Review of Systems  Review of Systems  Constitutional: Negative for chills and fever.  HENT: Negative for sore throat.   Eyes: Negative for pain.  Respiratory: Positive for shortness of breath. Negative for cough and stridor.   Cardiovascular: Positive for chest pain and palpitations.  Gastrointestinal: Negative for vomiting.  Genitourinary: Negative for dysuria.  Musculoskeletal: Negative for myalgias.  Skin: Negative for rash.  Neurological: Negative for seizures, loss of consciousness and headaches.  Psychiatric/Behavioral: Negative for suicidal ideas.  All other systems reviewed and are negative.     ____________________________________________   PHYSICAL EXAM:  VITAL SIGNS: ED Triage Vitals  Enc Vitals Group     BP 02/26/21 1418 122/82     Pulse Rate 02/26/21 1418 83     Resp 02/26/21 1418 16     Temp 02/26/21 1418 98.4 F (36.9 C)     Temp Source 02/26/21 1418 Oral     SpO2 02/26/21 1418 98 %     Weight 02/26/21 1416 271 lb (122.9 kg)     Height  02/26/21 1416 5\' 9"  (1.753 m)     Head Circumference --      Peak Flow --      Pain Score 02/26/21 1415 8     Pain Loc --      Pain Edu? --      Excl. in [place]? --    Vitals:   02/26/21 1418  BP: 122/82  Pulse: 83  Resp: 16  Temp: 98.4 F (36.9 C)  SpO2: 98%   Physical Exam Vitals and nursing note reviewed.  Constitutional:      General: She is not in acute distress.    Appearance: She is well-developed and well-nourished. She is obese.  HENT:      Head: Normocephalic and atraumatic.     Right Ear: External ear normal.     Left Ear: External ear normal.     Nose: Nose normal.  Eyes:     Conjunctiva/sclera: Conjunctivae normal.  Cardiovascular:     Rate and Rhythm: Normal rate and regular rhythm.     Heart sounds: No murmur heard.   Pulmonary:     Effort: Pulmonary effort is normal. No respiratory distress.     Breath sounds: Normal breath sounds.  Abdominal:     Palpations: Abdomen is soft.     Tenderness: There is no abdominal tenderness.  Musculoskeletal:        General: No edema.     Cervical back: Neck supple.  Skin:    General: Skin is warm and dry.     Capillary Refill: Capillary refill takes less than 2 seconds.  Neurological:     Mental Status: She is alert and oriented to person, place, and time.  Psychiatric:        Mood and Affect: Mood and affect and mood normal.      ____________________________________________   LABS (all labs ordered are listed, but only abnormal results are displayed)  Labs Reviewed  BASIC METABOLIC PANEL - Abnormal; Notable for the following components:      Result Value   Glucose, Bld 102 (*)    All other components within normal limits  CBC  D-DIMER, QUANTITATIVE  POC URINE PREG, ED  TROPONIN I (HIGH SENSITIVITY)  TROPONIN I (HIGH SENSITIVITY)   ____________________________________________  EKG  Sinus rhythm with a ventricular rate of 78, normal axis, unremarkable intervals and no clear evidence of acute ischemia although is a nonspecific change in lead III.  No other evidence of normalities ____________________________________________  RADIOLOGY  ED MD interpretation: No focal consolidation, large effusion, significant edema, no thorax or other clear acute intrathoracic process.  Official radiology report(s): DG Chest 2 View  Result Date: 02/26/2021 CLINICAL DATA:  Chest pain with palpitations. EXAM: CHEST - 2 VIEW COMPARISON:  January 03, 2020 FINDINGS: The heart  size and mediastinal contours are within normal limits. Both lungs are clear. The visualized skeletal structures are unremarkable. IMPRESSION: No active cardiopulmonary disease. Electronically Signed   By: Virgina Norfolk M.D.   On: 02/26/2021 14:44    ____________________________________________   PROCEDURES  Procedure(s) performed (including Critical Care):  .1-3 Lead EKG Interpretation Performed by: Lucrezia Starch, MD Authorized by: Lucrezia Starch, MD     Interpretation: normal     ECG rate assessment: normal     Rhythm: sinus rhythm     Ectopy: none     Conduction: normal       ____________________________________________   INITIAL IMPRESSION / ASSESSMENT AND PLAN / ED COURSE  Patient presents with above-stated exam for assessment of some chest discomfort that she initially noticed couple days ago but seem to little worse today associate with some shortness of breath mild cough and palpitations.  On arrival she is afebrile and hemodynamically stable.  Primary differential includes but is not limited to ACS, PE, pneumonia, bronchitis, pneumothorax, symptomatic effusion, symptomatic anemia and possible GI etiologies.  Chest x-ray shows no evidence of pneumonia, volume overload, pneumothorax or other acute intrathoracic process.  Low suspicion for PE as D-dimer is less than 0.5.  Very low suspicion for ACS or myocarditis given reassuring EKG and nonelevated troponin obtained greater than 3 hours after symptom onset..  CBC shows no leukocytosis or acute anemia and BMP shows no significant electrolyte or metabolic derangements.  Concern for possible bronchitis versus element of pleurisy.  Advised patient that she may continue taking her chronic pain medications and that she may follow-up with her PCP.  Given stable vitals otherwise reassuring exam work-up a very low suspicion for immediate life-threatening process and believe patient safe for discharge.  Discharged stable  condition.  Strict return precautions advised and discussed.        ____________________________________________   FINAL CLINICAL IMPRESSION(S) / ED DIAGNOSES  Final diagnoses:  Chest pain, unspecified type    Medications - No data to display   ED Discharge Orders    None       Note:  This document was prepared using Dragon voice recognition software and may include unintentional dictation errors.   Lucrezia Starch, MD 02/26/21 [phone]

## 2021-06-30 ENCOUNTER — Emergency Department: Payer: 59

## 2021-06-30 ENCOUNTER — Other Ambulatory Visit: Payer: Self-pay

## 2021-06-30 ENCOUNTER — Emergency Department
Admission: EM | Admit: 2021-06-30 | Discharge: 2021-06-30 | Disposition: A | Discharge status: Home / Self Care | Payer: 59 | Attending: Emergency Medicine | Admitting: Emergency Medicine

## 2021-06-30 DIAGNOSIS — B9689 Other specified bacterial agents as the cause of diseases classified elsewhere: Secondary | ICD-10-CM | POA: Diagnosis not present

## 2021-06-30 DIAGNOSIS — F17210 Nicotine dependence, cigarettes, uncomplicated: Secondary | ICD-10-CM | POA: Diagnosis not present

## 2021-06-30 DIAGNOSIS — N760 Acute vaginitis: Secondary | ICD-10-CM | POA: Diagnosis not present

## 2021-06-30 DIAGNOSIS — D259 Leiomyoma of uterus, unspecified: Secondary | ICD-10-CM

## 2021-06-30 DIAGNOSIS — N939 Abnormal uterine and vaginal bleeding, unspecified: Secondary | ICD-10-CM

## 2021-06-30 DIAGNOSIS — R102 Pelvic and perineal pain: Secondary | ICD-10-CM | POA: Diagnosis present

## 2021-06-30 LAB — POC URINE PREG, ED: Preg Test, Ur: NEGATIVE

## 2021-06-30 LAB — CBC
HCT: 44.3 % (ref 36.0–46.0)
Hemoglobin: 14.2 g/dL (ref 12.0–15.0)
MCH: 28.2 pg (ref 26.0–34.0)
MCHC: 32.1 g/dL (ref 30.0–36.0)
MCV: 88.1 fL (ref 80.0–100.0)
Platelets: 338 10*3/uL (ref 150–400)
RBC: 5.03 MIL/uL (ref 3.87–5.11)
RDW: 14.2 % (ref 11.5–15.5)
WBC: 7.4 10*3/uL (ref 4.0–10.5)
nRBC: 0 % (ref 0.0–0.2)

## 2021-06-30 LAB — URINALYSIS, COMPLETE (UACMP) WITH MICROSCOPIC
Bilirubin Urine: NEGATIVE
Glucose, UA: NEGATIVE mg/dL
Ketones, ur: NEGATIVE mg/dL
Leukocytes,Ua: NEGATIVE
Nitrite: NEGATIVE
Protein, ur: NEGATIVE mg/dL
RBC / HPF: >50 RBC/hpf — ABNORMAL HIGH (ref 0–5)
Specific Gravity, Urine: 1.023 (ref 1.005–1.030)
pH: 5 (ref 5.0–8.0)

## 2021-06-30 LAB — COMPREHENSIVE METABOLIC PANEL
ALT: 10 U/L (ref 0–44)
AST: 14 U/L — ABNORMAL LOW (ref 15–41)
Albumin: 4.4 g/dL (ref 3.5–5.0)
Alkaline Phosphatase: 70 U/L (ref 38–126)
Anion gap: 6 (ref 5–15)
BUN: 12 mg/dL (ref 6–20)
CO2: 24 mmol/L (ref 22–32)
Calcium: 9 mg/dL (ref 8.9–10.3)
Chloride: 109 mmol/L (ref 98–111)
Creatinine, Ser: 0.76 mg/dL (ref 0.44–1.00)
GFR, Estimated: >60 mL/min (ref >60)
Glucose, Bld: 103 mg/dL — ABNORMAL HIGH (ref 70–99)
Potassium: 3.8 mmol/L (ref 3.5–5.1)
Sodium: 139 mmol/L (ref 135–145)
Total Bilirubin: 0.7 mg/dL (ref 0.3–1.2)
Total Protein: 7.3 g/dL (ref 6.5–8.1)

## 2021-06-30 LAB — WET PREP, GENITAL
Sperm: NONE SEEN
Trich, Wet Prep: NONE SEEN
Yeast Wet Prep HPF POC: NONE SEEN

## 2021-06-30 LAB — TYPE AND SCREEN
ABO/RH(D): O POS
Antibody Screen: NEGATIVE

## 2021-06-30 LAB — CHLAMYDIA/NGC RT PCR (ARMC ONLY)
Chlamydia Tr: NOT DETECTED
N gonorrhoeae: NOT DETECTED

## 2021-06-30 LAB — LIPASE, BLOOD: Lipase: 24 U/L (ref 11–51)

## 2021-06-30 MED ORDER — METRONIDAZOLE 0.75 % VA GEL: 1.0000 | Freq: Two times a day (BID) | VAGINAL | 0 refills | Status: DC

## 2021-06-30 NOTE — ED Triage Notes (Signed)
Pt reports that she is having abd pain, blood clots with an odor for the last three weeks. States that the pain is getting worse.

## 2021-06-30 NOTE — Discharge Instructions (Addendum)
Follow-up with your regular doctor if not improving in 2 to 3 days.  Return emergency department worsening.  Use the MetroGel as prescribed.  You may also need to take a probiotic daily.

## 2021-06-30 NOTE — ED Provider Notes (Signed)
[facility] Emergency Department Provider Note  ____________________________________________   Event Date/Time   First MD Initiated Contact with Patient 06/30/21 1428     (approximate)  I have reviewed the triage vital signs and the nursing notes.   HISTORY  Chief Complaint Vaginal Bleeding    HPI Sarah Steele is a 40 y.o. female presents emergency department with vaginal bleeding for 3 weeks.  Patient states that he has had a foul odor.  Patient states that unsure if she was pregnant prior to the bleeding starting because she is sexually active.  States she keeps having crampy type pain.  States at times there will be a gush of blood and at other times it will be very scant like spotting.  She has had fever and chills.  No vomiting or diarrhea.  Past Medical History:  Diagnosis Date   Arthritis    Back pain    DDD (degenerative disc disease)    Heart murmur     There are no problems to display for this patient.   History reviewed. No pertinent surgical history.  Prior to Admission medications   Medication Sig Start Date End Date Taking? Authorizing Provider  metroNIDAZOLE (METROGEL VAGINAL) 0.75 % vaginal gel Place 1 Applicatorful vaginally 2 (two) times daily. 06/30/21  Yes Encarnacion Bole, Linden Dolin, PA-C  ALPRAZolam Duanne Moron) 0.25 MG tablet Take 0.25 mg by mouth daily as needed for anxiety.     [provider]  EPINEPHrine (EPIPEN 2-PAK) 0.3 mg/0.3 mL IJ SOAJ injection Inject 0.3 mg into the muscle once.    [provider]  tiZANidine (ZANAFLEX) 2 MG tablet Take 2 mg by mouth at bedtime as needed. 07/24/20   [provider]    Allergies Aleve cold & [pseudoephedrine-naproxen na er], Amoxicillin, Chloraseptic sore throat [acetaminophen], Clindamycin/lincomycin, Doxycycline, Flagyl [metronidazole hcl], Influenza vaccines, Neurontin [gabapentin], Nubain [nalbuphine hcl], Penicillins, Phenergan [promethazine hcl], Prednisone, Septra  [bactrim], and Ultram [tramadol hcl]  History reviewed. No pertinent family history.  Social History Social History   Tobacco Use   Smoking status: Every Day    Packs/day: 0.25    Pack years: 0.00    Types: Cigarettes   Smokeless tobacco: Never  Vaping Use   Vaping Use: Never used  Substance Use Topics   Alcohol use: Yes    Comment: once every 6 months   Drug use: No    Review of Systems  Constitutional: No fever/chills Eyes: No visual changes. ENT: No sore throat. Respiratory: Denies cough Cardiovascular: Denies chest pain Gastrointestinal: Denies abdominal pain Genitourinary: Negative for dysuria. Musculoskeletal: Negative for back pain. Skin: Negative for rash. Psychiatric: no mood changes,     ____________________________________________   PHYSICAL EXAM:  VITAL SIGNS: ED Triage Vitals  Enc Vitals Group     BP 06/30/21 1229 (!) 139/99     Pulse Rate 06/30/21 1229 75     Resp 06/30/21 1229 18     Temp 06/30/21 1229 99 F (37.2 C)     Temp Source 06/30/21 1229 Oral     SpO2 06/30/21 1229 100 %     Weight 06/30/21 1230 259 lb (117.5 kg)     Height 06/30/21 1230 5\' 9"  (1.753 m)     Head Circumference --      Peak Flow --      Pain Score 06/30/21 1229 7     Pain Loc --      Pain Edu? --      Excl. in [place]? --  Constitutional: Alert and oriented. Well appearing and in no acute distress. Eyes: Conjunctivae are normal.  Head: Atraumatic. Nose: No congestion/rhinnorhea. Mouth/Throat: Mucous membranes are moist.   Neck:  supple no lymphadenopathy noted Cardiovascular: Normal rate, regular rhythm. Heart sounds are normal Respiratory: Normal respiratory effort.  No retractions, lungs c t a  Abd: soft nontender bs normal all 4 quad GU: External pelvic exam shows dried blood, no herpetic lesions, speculum exam shows blood oozing from the os, there is a small amount of bleeding but nothing copious, no discolored discharge, samples for GC/chlamydia and wet  prep obtained Musculoskeletal: FROM all extremities, warm and well perfused Neurologic:  Normal speech and language.  Skin:  Skin is warm, dry and intact. No rash noted. Psychiatric: Mood and affect are normal. Speech and behavior are normal.  ____________________________________________   LABS (all labs ordered are listed, but only abnormal results are displayed)  Labs Reviewed  WET PREP, GENITAL - Abnormal; Notable for the following components:      Result Value   Clue Cells Wet Prep HPF POC PRESENT (*)    WBC, Wet Prep HPF POC FEW (*)    All other components within normal limits  COMPREHENSIVE METABOLIC PANEL - Abnormal; Notable for the following components:   Glucose, Bld 103 (*)    AST 14 (*)    All other components within normal limits  URINALYSIS, COMPLETE (UACMP) WITH MICROSCOPIC - Abnormal; Notable for the following components:   Color, Urine YELLOW (*)    APPearance HAZY (*)    Hgb urine dipstick LARGE (*)    RBC / HPF >50 (*)    Bacteria, UA RARE (*)    All other components within normal limits  CHLAMYDIA/NGC RT PCR ([facility] ONLY)            LIPASE, BLOOD  CBC  POC URINE PREG, ED  TYPE AND SCREEN   ____________________________________________   ____________________________________________  RADIOLOGY  Ultrasound of the pelvis  ____________________________________________   PROCEDURES  Procedure(s) performed: No  Procedures    ____________________________________________   INITIAL IMPRESSION / ASSESSMENT AND PLAN / ED COURSE  Pertinent labs & imaging results that were available during my care of the patient were reviewed by me and considered in my medical decision making (see chart for details).   The patient's 40 year old female presents emergency department with 3 weeks of vaginal bleeding.  See HPI.  Physical exam shows patient per stable  DDx: Vaginal bleeding, retained products of conception, fibroids, hypothyroidism, perimenopause, PID,  STD  CBC is reassuring, has normal levels, comprehensive metabolic panel is normal, lipase is normal, urinalysis has greater than 50 RBCs with rare bacteria  Wet prep and GC/chlamydia samples sent to the lab Ultrasound of the pelvis complete with transvaginal   Wet prep shows BV, std tests are negative US shows 3 fibroids , confirmed by radiology, reviewed by me  Did explain all of the findings to the patient, she is to f/u with gyn, she states she can't take flagyl but can use metrogel, rx for metrogel given, pt to also take probiotics, return if worsening, discharged in stable condition, discussed smoking cessation  Sarah Steele was evaluated in Emergency Department on 06/30/2021 for the symptoms described in the history of present illness. She was evaluated in the context of the global COVID-19 pandemic, which necessitated consideration that the patient might be at risk for infection with the SARS-CoV-2 virus that causes COVID-19. Institutional protocols and algorithms that pertain to the evaluation of patients  at risk for COVID-19 are in a state of rapid change based on information released by regulatory bodies including the CDC and federal and state organizations. These policies and algorithms were followed during the patient's care in the ED.    As part of my medical decision making, I reviewed the following data within the [place] notes reviewed and incorporated, Labs reviewed , Old chart reviewed, Radiograph reviewed , Notes from prior ED visits, and [place] Controlled Substance Database  ____________________________________________   FINAL CLINICAL IMPRESSION(S) / ED DIAGNOSES  Final diagnoses:  Vaginal bleeding  BV (bacterial vaginosis)  Uterine leiomyoma, unspecified location      NEW MEDICATIONS STARTED DURING THIS VISIT:  New Prescriptions   METRONIDAZOLE (METROGEL VAGINAL) 0.75 % VAGINAL GEL    Place 1 Applicatorful vaginally 2 (two) times daily.      Note:  This document was prepared using Dragon voice recognition software and may include unintentional dictation errors.    Versie Starks, PA-C 06/30/21 1810    Nance Pear, MD 07/01/21 [phone]

## 2021-06-30 NOTE — ED Triage Notes (Signed)
Pt arrives to ER c/o of being on her period for 3 weeks. States bright red and dark brown with clots, states it'll get lighter and then heavier again. States back pain and low abd pain. States R and L sided pain. States nausea. A&O, ambulatory. Denies taking birth control.

## 2021-11-17 ENCOUNTER — Emergency Department
Admission: EM | Admit: 2021-11-17 | Discharge: 2021-11-17 | Disposition: A | Discharge status: Home / Self Care | Payer: 59 | Attending: Emergency Medicine | Admitting: Emergency Medicine

## 2021-11-17 ENCOUNTER — Other Ambulatory Visit: Payer: Self-pay

## 2021-11-17 ENCOUNTER — Encounter: Payer: Self-pay | Admitting: Intensive Care

## 2021-11-17 DIAGNOSIS — R059 Cough, unspecified: Secondary | ICD-10-CM | POA: Diagnosis not present

## 2021-11-17 DIAGNOSIS — Z20822 Contact with and (suspected) exposure to covid-19: Secondary | ICD-10-CM | POA: Diagnosis not present

## 2021-11-17 DIAGNOSIS — M7918 Myalgia, other site: Secondary | ICD-10-CM | POA: Diagnosis present

## 2021-11-17 DIAGNOSIS — R5381 Other malaise: Secondary | ICD-10-CM | POA: Insufficient documentation

## 2021-11-17 DIAGNOSIS — J111 Influenza due to unidentified influenza virus with other respiratory manifestations: Secondary | ICD-10-CM

## 2021-11-17 DIAGNOSIS — F17210 Nicotine dependence, cigarettes, uncomplicated: Secondary | ICD-10-CM | POA: Insufficient documentation

## 2021-11-17 LAB — RESP PANEL BY RT-PCR (FLU A&B, COVID) ARPGX2
Influenza A by PCR: NEGATIVE
Influenza B by PCR: NEGATIVE
SARS Coronavirus 2 by RT PCR: NEGATIVE

## 2021-11-17 NOTE — ED Provider Notes (Signed)
Emergency Medicine Provider Triage Evaluation Note  Sarah Steele, a 40 y.o. female  was evaluated in triage.  Pt complains of flulike symptoms for the last few days.  Patient denies any recent test but does admit that her father lives with her, was diagnosed last week.  Review of Systems  Positive: Fatigue, cough Negative: NVD  Physical Exam  BP (!) 149/110 (BP Location: Left Arm)   Pulse 81   Temp 98.5 F (36.9 C) (Oral)   Resp 18   Ht 5\' 9"  (1.753 m)   Wt 117.9 kg   LMP 10/19/2021 (Approximate)   SpO2 99%   BMI 38.40 kg/m  Gen:   Awake, no distress  NAD Resp:  Normal effort CTA MSK:   Moves extremities without difficulty  Other:  CVS: RRR  Medical Decision Making  Medically screening exam initiated at 1:27 PM.  Appropriate orders placed.  Sarah Steele was informed that the remainder of the evaluation will be completed by another provider, this initial triage assessment does not replace that evaluation, and the importance of remaining in the ED until their evaluation is complete.  Patient with ED evaluation of symptoms concerning for influenza after close household contact.   Sarah Needles, PA-C 11/17/21 1328    Lucrezia Starch, MD 11/17/21 1754

## 2021-11-17 NOTE — ED Triage Notes (Signed)
Patient c/o flu like symptoms for a few days. Reports symptoms have not gotten better

## 2021-11-17 NOTE — Discharge Instructions (Signed)
Symptoms are concerning for influenza.  Continue to monitor and treat fevers with Tylenol or Motrin over-the-counter.  He may use Delsym cough syrup for additional cough relief.  Follow with primary provider return to the ED if needed.  Follow your results on [facility] MyChart.

## 2021-11-22 NOTE — ED Provider Notes (Signed)
[facility] Emergency Department Provider Note ____________________________________________  Time seen: 1325  I have reviewed the triage vital signs and the nursing notes.  HISTORY  Chief Complaint  Influenza  HPI Sarah Steele is a 40 y.o. female with complaints of flulike symptoms over the last several days.  Patient describes her symptoms of been persistent without benefit of over-the-counter medications.  She describes generalized malaise, body aches, and cough.  Past Medical History:  Diagnosis Date   Arthritis    Back pain    DDD (degenerative disc disease)    Heart murmur     There are no problems to display for this patient.   History reviewed. No pertinent surgical history.  Prior to Admission medications   Medication Sig Start Date End Date Taking? Authorizing Provider  ALPRAZolam Duanne Moron) 0.25 MG tablet Take 0.25 mg by mouth daily as needed for anxiety.     [provider]  EPINEPHrine (EPIPEN 2-PAK) 0.3 mg/0.3 mL IJ SOAJ injection Inject 0.3 mg into the muscle once.    [provider]  metroNIDAZOLE (METROGEL VAGINAL) 0.75 % vaginal gel Place 1 Applicatorful vaginally 2 (two) times daily. 06/30/21   Fisher, Linden Dolin, PA-C  tiZANidine (ZANAFLEX) 2 MG tablet Take 2 mg by mouth at bedtime as needed. 07/24/20   [provider]    Allergies Aleve cold & [pseudoephedrine-naproxen na er], Amoxicillin, Chloraseptic sore throat [acetaminophen], Clindamycin/lincomycin, Doxycycline, Flagyl [metronidazole hcl], Influenza vaccines, Neurontin [gabapentin], Nubain [nalbuphine hcl], Penicillins, Phenergan [promethazine hcl], Prednisone, Septra [bactrim], and Ultram [tramadol hcl]  History reviewed. No pertinent family history.  Social History Social History   Tobacco Use   Smoking status: Every Day    Packs/day: 0.25    Types: Cigarettes   Smokeless tobacco: Never  Vaping Use   Vaping Use: Never used  Substance Use Topics    Alcohol use: Yes    Comment: once every 6 months   Drug use: No    Review of Systems  Constitutional: Negative for fever. Eyes: Negative for visual changes. ENT: Negative for sore throat. Cardiovascular: Negative for chest pain. Respiratory: Negative for shortness of breath.  Intermittent cough Gastrointestinal: Negative for abdominal pain, vomiting and diarrhea. Genitourinary: Negative for dysuria. Musculoskeletal: Negative for back pain.  Generalized body aches Skin: Negative for rash. Neurological: Negative for headaches, focal weakness or numbness. ____________________________________________  PHYSICAL EXAM:  VITAL SIGNS: ED Triage Vitals  Enc Vitals Group     BP 11/17/21 1323 (!) 149/110     Pulse Rate 11/17/21 1323 81     Resp 11/17/21 1323 18     Temp 11/17/21 1323 98.5 F (36.9 C)     Temp Source 11/17/21 1323 Oral     SpO2 11/17/21 1323 99 %     Weight 11/17/21 1321 260 lb (117.9 kg)     Height 11/17/21 1321 5\' 9"  (1.753 m)     Head Circumference --      Peak Flow --      Pain Score 11/17/21 1321 7     Pain Loc --      Pain Edu? --      Excl. in [place]? --     Constitutional: Alert and oriented. Well appearing and in no distress. Head: Normocephalic and atraumatic. Eyes: Conjunctivae are normal. PERRL. Normal extraocular movements Ears: Canals clear.  Cardiovascular: Normal rate, regular rhythm. Normal distal pulses. Respiratory: Normal respiratory effort. No wheezes/rales/rhonchi. Gastrointestinal: Soft and nontender. No distention. Musculoskeletal: Nontender with normal range of motion in  all extremities.  Neurologic:  Normal gait without ataxia. Normal speech and language. No gross focal neurologic deficits are appreciated. Skin:  Skin is warm, dry and intact. No rash noted. Psychiatric: Mood and affect are normal. Patient exhibits appropriate insight and judgment. ____________________________________________    {LABS (pertinent positives/negatives) Labs  Reviewed  RESP PANEL BY RT-PCR (FLU A&B, COVID) ARPGX2  ____________________________________________  {EKG  ____________________________________________   RADIOLOGY Official radiology report(s): No results found. ____________________________________________  PROCEDURES   Procedures ____________________________________________   INITIAL IMPRESSION / ASSESSMENT AND PLAN / ED COURSE  As part of my medical decision making, I reviewed the following data within the [place] reviewed   and Notes from prior ED visits   DDX: influenza, Covid, viral URI  Patient to the ED with influenza-like symptoms patient is evaluated for complaints in the ED, and has a pending flu swab at this time.  Patient with clinical diagnosis will go home and manage symptoms symptomatically.  Return precautions of been discussed.  Work note is provided as appropriate.  Sarah Steele was evaluated in Emergency Department on 11/22/2021 for the symptoms described in the history of present illness. She was evaluated in the context of the global COVID-19 pandemic, which necessitated consideration that the patient might be at risk for infection with the SARS-CoV-2 virus that causes COVID-19. Institutional protocols and algorithms that pertain to the evaluation of patients at risk for COVID-19 are in a state of rapid change based on information released by regulatory bodies including the CDC and federal and state organizations. These policies and algorithms were followed during the patient's care in the ED. ____________________________________________  FINAL CLINICAL IMPRESSION(S) / ED DIAGNOSES  Final diagnoses:  Influenza-like illness      Carmie End, Dannielle Karvonen, PA-C 11/22/21 1249    Naaman Plummer, MD 11/30/21 [phone]

## 2022-01-05 ENCOUNTER — Ambulatory Visit
Admission: EM | Admit: 2022-01-05 | Discharge: 2022-01-05 | Disposition: A | Discharge status: Home / Self Care | Payer: 59 | Attending: Internal Medicine | Admitting: Internal Medicine

## 2022-01-05 ENCOUNTER — Other Ambulatory Visit: Payer: Self-pay

## 2022-01-05 ENCOUNTER — Encounter: Payer: Self-pay | Admitting: Emergency Medicine

## 2022-01-05 DIAGNOSIS — G43701 Chronic migraine without aura, not intractable, with status migrainosus: Secondary | ICD-10-CM | POA: Diagnosis not present

## 2022-01-05 MED ORDER — KETOROLAC TROMETHAMINE 30 MG/ML IJ SOLN
30.0000 mg | Freq: Once | INTRAMUSCULAR | Status: AC
  Administered 2022-01-05: 30 mg via INTRAMUSCULAR

## 2022-01-05 MED ORDER — ONDANSETRON HCL 4 MG/2ML IJ SOLN
4.0000 mg | Freq: Once | INTRAMUSCULAR | Status: AC
  Administered 2022-01-05: 4 mg via INTRAMUSCULAR

## 2022-01-05 NOTE — ED Provider Notes (Addendum)
[facility]    CSN: [id number] Arrival date & time: 01/05/22  9449      History   Chief Complaint Chief Complaint  Patient presents with   Headache    HPI Sarah Steele is a 41 y.o. female.   Patient presents with headache that has been present since yesterday.  She reports that she does have history of migraines and has seen resolution of headache with "injection" the past.  Patient does not take any medication for migraines other than ibuprofen.  She took ibuprofen with no resolution of headache.  She also reports that she thinks she "has "sinus problems" that started yesterday as she feels sinus pressure.  Denies nasal congestion, runny nose, cough, fever.  She also had associated dizziness, blurred vision, nausea vomiting which is typical for her migraines.  She reports that headache feels typical for her chronic migraines as well.  Denies any head trauma recently.  Patient does not take any blood thinners.   Headache  Past Medical History:  Diagnosis Date   Arthritis    Back pain    DDD (degenerative disc disease)    Heart murmur     There are no problems to display for this patient.   History reviewed. No pertinent surgical history.  OB History   No obstetric history on file.      Home Medications    Prior to Admission medications   Medication Sig Start Date End Date Taking? Authorizing Provider  ALPRAZolam Duanne Moron) 0.25 MG tablet Take 0.25 mg by mouth daily as needed for anxiety.     [provider]  EPINEPHrine (EPIPEN 2-PAK) 0.3 mg/0.3 mL IJ SOAJ injection Inject 0.3 mg into the muscle once.    [provider]  metroNIDAZOLE (METROGEL VAGINAL) 0.75 % vaginal gel Place 1 Applicatorful vaginally 2 (two) times daily. 06/30/21   Fisher, Linden Dolin, PA-C  tiZANidine (ZANAFLEX) 2 MG tablet Take 2 mg by mouth at bedtime as needed. 07/24/20   [provider]    Family History History reviewed. No pertinent family history.  Social  History Social History   Tobacco Use   Smoking status: Every Day    Packs/day: 0.25    Types: Cigarettes   Smokeless tobacco: Never  Vaping Use   Vaping Use: Never used  Substance Use Topics   Alcohol use: Yes    Comment: once every 6 months   Drug use: No     Allergies   Aleve cold & [pseudoephedrine-naproxen na er], Amoxicillin, Chloraseptic sore throat [acetaminophen], Clindamycin/lincomycin, Doxycycline, Flagyl [metronidazole hcl], Influenza vaccines, Neurontin [gabapentin], Nubain [nalbuphine hcl], Penicillins, Phenergan [promethazine hcl], Prednisone, Septra [bactrim], and Ultram [tramadol hcl]   Review of Systems Review of Systems Per HPI  Physical Exam Triage Vital Signs ED Triage Vitals  Enc Vitals Group     BP 01/05/22 0938 (!) 161/100     Pulse Rate 01/05/22 0938 79     Resp 01/05/22 0938 16     Temp 01/05/22 0938 98.1 F (36.7 C)     Temp Source 01/05/22 0938 Oral     SpO2 01/05/22 0938 97 %     Weight --      Height --      Head Circumference --      Peak Flow --      Pain Score 01/05/22 0940 7     Pain Loc --      Pain Edu? --      Excl. in [place]? --  No data found.  Updated Vital Signs BP (!) 161/100 (BP Location: Left Arm)    Pulse 79    Temp 98.1 F (36.7 C) (Oral)    Resp 16    SpO2 97%   Visual Acuity Right Eye Distance:   Left Eye Distance:   Bilateral Distance:    Right Eye Near:   Left Eye Near:    Bilateral Near:     Physical Exam Constitutional:      General: She is not in acute distress.    Appearance: Normal appearance. She is not toxic-appearing or diaphoretic.  HENT:     Head: Normocephalic and atraumatic.     Right Ear: Tympanic membrane and ear canal normal.     Left Ear: Tympanic membrane and ear canal normal.     Nose: Nose normal.     Mouth/Throat:     Mouth: Mucous membranes are moist.     Pharynx: No posterior oropharyngeal erythema.  Eyes:     Extraocular Movements: Extraocular movements intact.      Conjunctiva/sclera: Conjunctivae normal.     Pupils: Pupils are equal, round, and reactive to light.  Cardiovascular:     Rate and Rhythm: Normal rate and regular rhythm.     Pulses: Normal pulses.     Heart sounds: Normal heart sounds.  Pulmonary:     Effort: Pulmonary effort is normal. No respiratory distress.     Breath sounds: Normal breath sounds.  Skin:    General: Skin is warm and dry.  Neurological:     General: No focal deficit present.     Mental Status: She is alert and oriented to person, place, and time. Mental status is at baseline.     Cranial Nerves: Cranial nerves 2-12 are intact.     Sensory: Sensation is intact.     Motor: Motor function is intact.     Coordination: Coordination is intact.     Gait: Gait is intact.  Psychiatric:        Mood and Affect: Mood normal.        Behavior: Behavior normal.        Thought Content: Thought content normal.        Judgment: Judgment normal.     UC Treatments / Results  Labs (all labs ordered are listed, but only abnormal results are displayed) Labs Reviewed - No data to display  EKG   Radiology No results found.  Procedures Procedures (including critical care time)  Medications Ordered in UC Medications  ketorolac (TORADOL) 30 MG/ML injection 30 mg (30 mg Intramuscular Given 01/05/22 1031)  ondansetron (ZOFRAN) injection 4 mg (4 mg Intramuscular Given 01/05/22 1029)    Initial Impression / Assessment and Plan / UC Course  I have reviewed the triage vital signs and the nursing notes.  Pertinent labs & imaging results that were available during my care of the patient were reviewed by me and considered in my medical decision making (see chart for details).     Physical exam is consistent with possible migraine.  No obvious sinus issues on exam.  Neuro exam is normal and vital signs are stable so do not think that patient is in need of immediate medical attention at the hospital at this time.  Will treat with  ketorolac and ondansetron.  Patient advised to go to the hospital if no improvement in headache in the next 24 to 48 hours.  Patient advised to not take any additional NSAIDs for at least 24  hours following injection.  Patient verbalized understanding and was agreeable with plan. Final Clinical Impressions(s) / UC Diagnoses   Final diagnoses:  Chronic migraine without aura with status migrainosus, not intractable     Discharge Instructions      You were given medication for nausea and headache in urgent care today.  Please go to the hospital if no improvement in symptoms in the next 24 to 48 hours.    ED Prescriptions   None    PDMP not reviewed this encounter.   Teodora Medici, [place] 01/05/22 [place], [place], [place] 01/05/22 1048

## 2022-01-05 NOTE — ED Triage Notes (Addendum)
Hx of migraines. States one started last night, worse this morning. States this does feel like one of her typical migraines. Reports last time this happened she got a shot of a medication that helped it to feel better. Denies head trauma, loss of consciousness, vomiting. Last took ibuprofen around 0700 this morning, 400 mg.

## 2022-01-05 NOTE — Discharge Instructions (Signed)
You were given medication for nausea and headache in urgent care today.  Please go to the hospital if no improvement in symptoms in the next 24 to 48 hours.

## 2022-04-25 ENCOUNTER — Telehealth: Payer: Self-pay

## 2022-04-25 NOTE — Telephone Encounter (Signed)
NOTES SCANNED TO REFERRAL 

## 2022-04-25 NOTE — Progress Notes (Signed)
?Cardiology Office Note:   ? ?Date:  04/26/2022  ? ?ID:  Andreya Lacks, DOB 1981-09-16, MRN [id number] ? ?PCP:  Patient, No Pcp Per (Inactive) ?  ?[facility] Providers ?Cardiologist:  Janina Mayo, MD    ? ?Referring MD: Carylon Perches, NP  ? ?No chief complaint on file. ??Afib ? ?History of Present Illness:   ? ?Suda Forbess is a 41 y.o. female with a hx of chronic pain, referral noted for ?afib ( no prior ECGs in the system to confirm) ? ?Mrs Mckiver notes she was told she had atrial fibrillation by a prior cardiologist. There are no records. She notes occasional palpiations ? ? ?Past Medical History:  ?Diagnosis Date  ? Arthritis   ? Back pain   ? DDD (degenerative disc disease)   ? Heart murmur   ? ? ?No past surgical history on file. ? ?Current Medications: ?Current Meds  ?Medication Sig  ? chlorthalidone (HYGROTON) 25 MG tablet Take 1 tablet (25 mg total) by mouth daily.  ? ibuprofen (ADVIL) 200 MG tablet Take 200 mg by mouth every 6 (six) hours as needed.  ? loratadine (CLARITIN) 10 MG tablet Take 10 mg by mouth daily.  ? losartan (COZAAR) 25 MG tablet Take 1 tablet (25 mg total) by mouth daily.  ? oxyCODONE-acetaminophen (PERCOCET/ROXICET) 5-325 MG tablet Take by mouth 3 (three) times daily.  ? tiZANidine (ZANAFLEX) 2 MG tablet Take 2 mg by mouth at bedtime as needed.  ?  ? ?Allergies:   Aleve cold & [pseudoephedrine-naproxen na er], Amoxicillin, Chloraseptic sore throat [acetaminophen], Clindamycin/lincomycin, Doxycycline, Flagyl [metronidazole hcl], Influenza vaccines, Neurontin [gabapentin], Nubain [nalbuphine hcl], Penicillins, Phenergan [promethazine hcl], Prednisone, Septra [bactrim], and Ultram [tramadol hcl]  ? ?Social History  ? ?Socioeconomic History  ? Marital status: Single  ?  Spouse name: Not on file  ? Number of children: Not on file  ? Years of education: Not on file  ? Highest education level: Not on file  ?Occupational History  ? Not on file  ?Tobacco Use  ? Smoking status: Every Day  ?   Packs/day: 0.25  ?  Types: Cigarettes  ? Smokeless tobacco: Never  ?Vaping Use  ? Vaping Use: Never used  ?Substance and Sexual Activity  ? Alcohol use: Yes  ?  Comment: once every 6 months  ? Drug use: No  ? Sexual activity: Yes  ?  Birth control/protection: Condom  ?Other Topics Concern  ? Not on file  ?Social History Narrative  ? Not on file  ? ?Social Determinants of Health  ? ?Financial Resource Strain: Not on file  ?Food Insecurity: Not on file  ?Transportation Needs: Not on file  ?Physical Activity: Not on file  ?Stress: Not on file  ?Social Connections: Not on file  ?  ? ?Family History: ?The patient's father and sister with HTN ? ?ROS:   ?Please see the history of present illness.    ? All other systems reviewed and are negative. ? ?EKGs/Labs/Other Studies Reviewed:   ? ?The following studies were reviewed today: ? ? ?EKG:  EKG is  ordered today.  The ekg ordered today demonstrates  ? ?5/5-NSR ? ?Recent Labs: ?06/30/2021: ALT 10; BUN 12; Creatinine, Ser 0.76; Hemoglobin 14.2; Platelets 338; Potassium 3.8; Sodium 139  ?Recent Lipid Panel ?   ?Component Value Date/Time  ? CHOL 153 11/15/2008 2110  ? TRIG 225 (H) 11/15/2008 2110  ? HDL 32 (L) 11/15/2008 2110  ? CHOLHDL 4.8 Ratio 11/15/2008 2110  ? VLDL 45 (  H) 11/15/2008 2110  ? [place] 76 11/15/2008 2110  ? ? ? ?Risk Assessment/Calculations:   ?  ? ?    ? ?Physical Exam:   ? ?VS:   ? ?Vitals:  ? 04/26/22 0953  ?BP: (!) 150/98  ?Pulse: 74  ? ? ? ?Wt Readings from Last 3 Encounters:  ?04/26/22 275 lb (124.7 kg)  ?11/17/21 260 lb (117.9 kg)  ?06/30/21 259 lb (117.5 kg)  ?  ? ?GEN:  Well nourished, well developed in no acute distress ?HEENT: Normal ?NECK: No JVD; No carotid bruits ?LYMPHATICS: No lymphadenopathy ?CARDIAC: RRR, no murmurs, rubs, gallops ?RESPIRATORY:  Clear to auscultation without rales, wheezing or rhonchi  ?ABDOMEN: Soft, non-tender, non-distended ?MUSCULOSKELETAL:  No edema; No deformity  ?SKIN: Warm and dry ?NEUROLOGIC:  Alert and oriented x  3 ?PSYCHIATRIC:  Normal affect  ? ?ASSESSMENT:   ? ??AFIb: I don't have any records or EKG showing afib. Will obtain a cardiac monitor ? ?HTN: elevated. Will start antihypertensives ? ?PLAN:   ? ?In order of problems listed above: ? ?2 weeks preventice ?Start chlorthalidone 25 mg daily ?Start losartan 20 mg daily ?Follow up 3 months ? ?   ? ?   ? ? ?Medication Adjustments/Labs and Tests Ordered: ?Current medicines are reviewed at length with the patient today.  Concerns regarding medicines are outlined above.  ?Orders Placed This Encounter  ?Procedures  ? LONG TERM MONITOR (3-14 DAYS)  ? ?Meds ordered this encounter  ?Medications  ? losartan (COZAAR) 25 MG tablet  ?  Sig: Take 1 tablet (25 mg total) by mouth daily.  ?  Dispense:  90 tablet  ?  Refill:  3  ? chlorthalidone (HYGROTON) 25 MG tablet  ?  Sig: Take 1 tablet (25 mg total) by mouth daily.  ?  Dispense:  90 tablet  ?  Refill:  3  ? ? ?Patient Instructions  ?Medication Instructions:  ?No Changes In Medications at this time.  ?*If you need a refill on your cardiac medications before your next appointment, please call your pharmacy* ? ?Lab Work: ?None Ordered At This Time.  ?If you have labs (blood work) drawn today and your tests are completely normal, you will receive your results only by: ?MyChart Message (if you have MyChart) OR ?A paper copy in the mail ?If you have any lab test that is abnormal or we need to change your treatment, we will call you to review the results. ? ?Testing/Procedures: ? ?[employer] Cardiac Event Monitor Instructions ?Your physician has requested you wear your cardiac event monitor for __14___ days, (1-30). ?[employer] may call or text to confirm a shipping address. The monitor will be sent to a land address via UPS. ?[employer] will not ship a monitor to a PO BOX. It typically takes 3-5 days to receive your monitor after it has ?been enrolled. [employer] will assist with USPS tracking if your package is delayed. The telephone  number for ?[employer] is [phone]. ?Once you have received your monitor, please review the enclosed instructions. Instruction tutorials can also be ?viewed under help and settings on the enclosed cell phone. Your monitor has already been registered assigning ?a specific monitor serial # to you. ? ?Applying the monitor ?Remove cell phone from case and turn it on. The cell phone works as Dealer and needs to be within ?10 feet of you at all times. The cell phone will need to be charged on a daily basis. We recommend you plug the ?cell phone into the enclosed  charger at your bedside table every night. ? ?Monitor batteries: You will receive two monitor batteries labelled #1 and #2. These are your recorders. Plug ?battery #2 onto the second connection on the enclosed charger. Keep one battery on the charger at all times. ?This will keep the monitor battery deactivated. It will also keep it fully charged for when you need to switch ?your monitor batteries. A small light will be blinking on the battery emblem when it is charging. The light on the ?battery emblem will remain on when the battery is fully charged. ? ?Open package of a Monitor strip. Insert battery #1 into black hood on strip and gently squeeze monitor battery ?onto connection as indicated in instruction booklet. Set aside while preparing skin. ? ?Choose location for your strip, vertical or horizontal, as indicated in the instruction booklet. Shave to remove all ?hair from location. There cannot be any lotions, oils, powders, or colognes on skin where monitor is to be ?applied. Wipe skin clean with enclosed Saline wipe. Dry skin completely. ? ?Peel paper labeled #1 off the back of the Monitor strip exposing the adhesive. Place the monitor on the chest in ?the vertical or horizontal position shown in the instruction booklet. One arrow on the monitor strip must be ?pointing upward. Carefully remove paper labeled #2, attaching remainder of strip  to your skin. Try not to ?create any folds or wrinkles in the strip as you apply it. ? ?Firmly press and release the circle in the center of the monitor battery. You will hear a small beep. This is ?turning the [facility]

## 2022-04-26 ENCOUNTER — Encounter: Payer: Self-pay | Admitting: Internal Medicine

## 2022-04-26 ENCOUNTER — Ambulatory Visit: Payer: 59 | Admitting: Internal Medicine

## 2022-04-26 ENCOUNTER — Ambulatory Visit (INDEPENDENT_AMBULATORY_CARE_PROVIDER_SITE_OTHER): Payer: 59

## 2022-04-26 VITALS — BP 150/98 | HR 74 | Ht 69.0 in | Wt 275.0 lb

## 2022-04-26 DIAGNOSIS — R002 Palpitations: Secondary | ICD-10-CM

## 2022-04-26 DIAGNOSIS — I10 Essential (primary) hypertension: Secondary | ICD-10-CM | POA: Diagnosis not present

## 2022-04-26 MED ORDER — CHLORTHALIDONE 25 MG PO TABS: 25.0000 mg | ORAL_TABLET | Freq: Every day | ORAL | 3 refills | Status: DC

## 2022-04-26 MED ORDER — LOSARTAN POTASSIUM 25 MG PO TABS: 25.0000 mg | ORAL_TABLET | Freq: Every day | ORAL | 3 refills | Status: DC

## 2022-04-26 NOTE — Addendum Note (Signed)
Addended by: Rexanne Mano B on: 04/26/2022 01:19 PM ? ? Modules accepted: Orders ? ?

## 2022-04-26 NOTE — Progress Notes (Unsigned)
Enrolled patient for a 14 day [employer] Long Term monitor ?

## 2022-04-26 NOTE — Patient Instructions (Signed)
Medication Instructions:  ?No Changes In Medications at this time.  ?*If you need a refill on your cardiac medications before your next appointment, please call your pharmacy* ? ?Lab Work: ?None Ordered At This Time.  ?If you have labs (blood work) drawn today and your tests are completely normal, you will receive your results only by: ?MyChart Message (if you have MyChart) OR ?A paper copy in the mail ?If you have any lab test that is abnormal or we need to change your treatment, we will call you to review the results. ? ?Testing/Procedures: ? ?[employer] Cardiac Event Monitor Instructions ?Your physician has requested you wear your cardiac event monitor for __14___ days, (1-30). ?[employer] may call or text to confirm a shipping address. The monitor will be sent to a land address via UPS. ?[employer] will not ship a monitor to a PO BOX. It typically takes 3-5 days to receive your monitor after it has ?been enrolled. [employer] will assist with USPS tracking if your package is delayed. The telephone number for ?[employer] is [phone]. ?Once you have received your monitor, please review the enclosed instructions. Instruction tutorials can also be ?viewed under help and settings on the enclosed cell phone. Your monitor has already been registered assigning ?a specific monitor serial # to you. ? ?Applying the monitor ?Remove cell phone from case and turn it on. The cell phone works as Dealer and needs to be within ?10 feet of you at all times. The cell phone will need to be charged on a daily basis. We recommend you plug the ?cell phone into the enclosed charger at your bedside table every night. ? ?Monitor batteries: You will receive two monitor batteries labelled #1 and #2. These are your recorders. Plug ?battery #2 onto the second connection on the enclosed charger. Keep one battery on the charger at all times. ?This will keep the monitor battery deactivated. It will also keep it fully charged for  when you need to switch ?your monitor batteries. A small light will be blinking on the battery emblem when it is charging. The light on the ?battery emblem will remain on when the battery is fully charged. ? ?Open package of a Monitor strip. Insert battery #1 into black hood on strip and gently squeeze monitor battery ?onto connection as indicated in instruction booklet. Set aside while preparing skin. ? ?Choose location for your strip, vertical or horizontal, as indicated in the instruction booklet. Shave to remove all ?hair from location. There cannot be any lotions, oils, powders, or colognes on skin where monitor is to be ?applied. Wipe skin clean with enclosed Saline wipe. Dry skin completely. ? ?Peel paper labeled #1 off the back of the Monitor strip exposing the adhesive. Place the monitor on the chest in ?the vertical or horizontal position shown in the instruction booklet. One arrow on the monitor strip must be ?pointing upward. Carefully remove paper labeled #2, attaching remainder of strip to your skin. Try not to ?create any folds or wrinkles in the strip as you apply it. ? ?Firmly press and release the circle in the center of the monitor battery. You will hear a small beep. This is ?turning the monitor battery on. The heart emblem on the monitor battery will light up every 5 seconds if the ?monitor battery in turned on and connected to the patient securely. Do not push and hold the circle down as ?this turns the monitor battery off. The cell phone will locate the monitor battery. A screen will  appear on the ?cell phone checking the connection of your monitor strip. This may read poor connection initially but change to ?good connection within the next minute. Once your monitor accepts the connection you will hear a series of 3 ?beeps followed by a climbing crescendo of beeps. A screen will appear on the cell phone showing the two ?monitor strip placement options. Touch the picture that demonstrates where  you applied the monitor strip. ? ?Your monitor strip and battery are waterproof. You are able to shower, bathe, or swim with the monitor on. ?They just ask you do not submerge deeper than 3 feet underwater. We recommend removing the monitor if ?you are swimming in a lake, river, or ocean. ? ?Your monitor battery will need to be switched to a fully charged monitor battery approximately once a week. ?The cell phone will alert you of an action which needs to be made. ? ?On the cell phone, tap for details to reveal connection status, monitor battery status, and cell phone battery ?status. The green dots indicates your monitor is in good status. A red dot indicates there is something that ?needs your attention. ? ?To record a symptom, click the circle on the monitor battery. In 30-60 seconds a list of symptoms will appear on ?the cell phone. Select your symptom and tap save. Your monitor will record a sustained or significant ?arrhythmia regardless of you clicking the button. Some patients do not feel the heart rhythm irregularities. ?[employer] will notify us of any serious or critical events. ? ?Refer to instruction booklet for instructions on switching batteries, changing strips, the Do not disturb or Pause ?features, or any additional questions. ? ?Call [employer] at [phone], to confirm your monitor is transmitting and record your baseline. They will ?answer any questions you may have regarding the monitor instructions at that time. ? ?Returning the monitor to [employer] ?Place all equipment back into blue box. Peel off strip of paper to expose adhesive and close box securely. There ?is a prepaid UPS shipping label on this box. Drop in a UPS drop box, or at a UPS facility like [employer]. You may ?also contact [employer] to arrange UPS to pick up monitor package at your home. ? ?Follow-Up: ?At [facility], you and your health needs are our priority.  As part of our continuing mission to provide you with  exceptional heart care, we have created designated Provider Care Teams.  These Care Teams include your primary Cardiologist (physician) and Advanced Practice Providers (APPs -  Physician Assistants and Nurse Practitioners) who all work together to provide you with the care you need, when you need it. ? ?Your next appointment:   ?3 month(s) ? ?The format for your next appointment:   ?In Person ? ?Provider:   ?Janina Mayo, MD   ? ? ? ? ? ?  ?

## 2022-04-30 DIAGNOSIS — R002 Palpitations: Secondary | ICD-10-CM

## 2022-05-11 ENCOUNTER — Encounter: Payer: Self-pay | Admitting: Emergency Medicine

## 2022-05-11 ENCOUNTER — Ambulatory Visit
Admission: EM | Admit: 2022-05-11 | Discharge: 2022-05-11 | Disposition: A | Discharge status: Home / Self Care | Payer: 59 | Attending: Internal Medicine | Admitting: Internal Medicine

## 2022-05-11 ENCOUNTER — Other Ambulatory Visit: Payer: Self-pay

## 2022-05-11 DIAGNOSIS — J069 Acute upper respiratory infection, unspecified: Secondary | ICD-10-CM | POA: Diagnosis not present

## 2022-05-11 DIAGNOSIS — J029 Acute pharyngitis, unspecified: Secondary | ICD-10-CM | POA: Diagnosis present

## 2022-05-11 LAB — POCT RAPID STREP A (OFFICE): Rapid Strep A Screen: NEGATIVE

## 2022-05-11 MED ORDER — BENZONATATE 100 MG PO CAPS: 100.0000 mg | ORAL_CAPSULE | Freq: Three times a day (TID) | ORAL | 0 refills | Status: DC | PRN

## 2022-05-11 NOTE — ED Provider Notes (Signed)
[facility]    CSN: [id number] Arrival date & time: 05/11/22  0801      History   Chief Complaint Chief Complaint  Patient presents with   Otalgia    HPI Sarah Steele is a 41 y.o. female.   Patient presents with bilateral ear pain, sore throat, headache, nasal congestion, cough that started last night.  Denies any known sick contacts.  Patient had Tmax of 99.6.  Patient has taken ibuprofen for symptoms.  Denies chest pain, shortness of breath, nausea, vomiting, diarrhea, abdominal pain.   Otalgia  Past Medical History:  Diagnosis Date   Arthritis    Back pain    DDD (degenerative disc disease)    Heart murmur     There are no problems to display for this patient.   History reviewed. No pertinent surgical history.  OB History   No obstetric history on file.      Home Medications    Prior to Admission medications   Medication Sig Start Date End Date Taking? Authorizing Provider  benzonatate (TESSALON) 100 MG capsule Take 1 capsule (100 mg total) by mouth every 8 (eight) hours as needed for cough. 05/11/22  Yes Sarah-Jane Nazario, Hildred Alamin E, FNP  chlorthalidone (HYGROTON) 25 MG tablet Take 1 tablet (25 mg total) by mouth daily. 04/26/22 04/21/23 Yes BranchRoyetta Crochet, MD  ibuprofen (ADVIL) 200 MG tablet Take 200 mg by mouth every 6 (six) hours as needed.   Yes [provider]  loratadine (CLARITIN) 10 MG tablet Take 10 mg by mouth daily.   Yes [provider]  losartan (COZAAR) 25 MG tablet Take 1 tablet (25 mg total) by mouth daily. 04/26/22 04/21/23 Yes BranchRoyetta Crochet, MD  oxyCODONE-acetaminophen (PERCOCET/ROXICET) 5-325 MG tablet Take by mouth 3 (three) times daily.   Yes [provider]  tiZANidine (ZANAFLEX) 2 MG tablet Take 2 mg by mouth at bedtime as needed. 07/24/20  Yes [provider]    Family History History reviewed. No pertinent family history.  Social History Social History   Tobacco Use   Smoking status: Every Day     Packs/day: 0.25    Types: Cigarettes   Smokeless tobacco: Never  Vaping Use   Vaping Use: Never used  Substance Use Topics   Alcohol use: Yes    Comment: once every 6 months   Drug use: No     Allergies   Aleve cold & [pseudoephedrine-naproxen na er], Amoxicillin, Chloraseptic sore throat [acetaminophen], Clindamycin/lincomycin, Doxycycline, Flagyl [metronidazole hcl], Influenza vaccines, Neurontin [gabapentin], Nubain [nalbuphine hcl], Penicillins, Phenergan [promethazine hcl], Prednisone, Septra [bactrim], and Ultram [tramadol hcl]   Review of Systems Review of Systems Per HPI  Physical Exam Triage Vital Signs ED Triage Vitals  Enc Vitals Group     BP 05/11/22 0814 (!) 144/82     Pulse Rate 05/11/22 0814 85     Resp 05/11/22 0814 18     Temp 05/11/22 0814 98.7 F (37.1 C)     Temp Source 05/11/22 0814 Oral     SpO2 05/11/22 0814 96 %     Weight 05/11/22 0815 273 lb (123.8 kg)     Height 05/11/22 0815 '5\' 9"'$  (1.753 m)     Head Circumference --      Peak Flow --      Pain Score 05/11/22 0815 7     Pain Loc --      Pain Edu? --      Excl. in [place]? --  No data found.  Updated Vital Signs BP (!) 144/82 (BP Location: Left Arm)   Pulse 85   Temp 98.7 F (37.1 C) (Oral)   Resp 18   Ht '5\' 9"'$  (1.753 m)   Wt 273 lb (123.8 kg)   LMP 04/11/2022   SpO2 96%   BMI 40.32 kg/m   Visual Acuity Right Eye Distance:   Left Eye Distance:   Bilateral Distance:    Right Eye Near:   Left Eye Near:    Bilateral Near:     Physical Exam Constitutional:      General: She is not in acute distress.    Appearance: Normal appearance. She is not toxic-appearing or diaphoretic.  HENT:     Head: Normocephalic and atraumatic.     Right Ear: Tympanic membrane and ear canal normal.     Left Ear: Tympanic membrane and ear canal normal.     Nose: Congestion present.     Mouth/Throat:     Mouth: Mucous membranes are moist.     Pharynx: Posterior oropharyngeal erythema present.   Eyes:     Extraocular Movements: Extraocular movements intact.     Conjunctiva/sclera: Conjunctivae normal.     Pupils: Pupils are equal, round, and reactive to light.  Cardiovascular:     Rate and Rhythm: Normal rate and regular rhythm.     Pulses: Normal pulses.     Heart sounds: Normal heart sounds.  Pulmonary:     Effort: Pulmonary effort is normal. No respiratory distress.     Breath sounds: Normal breath sounds. No stridor. No wheezing, rhonchi or rales.  Abdominal:     General: Abdomen is flat. Bowel sounds are normal.     Palpations: Abdomen is soft.  Musculoskeletal:        General: Normal range of motion.     Cervical back: Normal range of motion.  Skin:    General: Skin is warm and dry.  Neurological:     General: No focal deficit present.     Mental Status: She is alert and oriented to person, place, and time. Mental status is at baseline.  Psychiatric:        Mood and Affect: Mood normal.        Behavior: Behavior normal.     UC Treatments / Results  Labs (all labs ordered are listed, but only abnormal results are displayed) Labs Reviewed  CULTURE, GROUP A STREP ([place])  COVID-19, FLU A+B NAA  POCT RAPID STREP A (OFFICE)    EKG   Radiology No results found.  Procedures Procedures (including critical care time)  Medications Ordered in UC Medications - No data to display  Initial Impression / Assessment and Plan / UC Course  I have reviewed the triage vital signs and the nursing notes.  Pertinent labs & imaging results that were available during my care of the patient were reviewed by me and considered in my medical decision making (see chart for details).     Patient presents with symptoms likely from a viral upper respiratory infection. Differential includes bacterial pneumonia, sinusitis, allergic rhinitis, COVID-19, flu. Do not suspect underlying cardiopulmonary process. Symptoms seem unlikely related to ACS, CHF or COPD exacerbations,  pneumonia, pneumothorax. Patient is nontoxic appearing and not in need of emergent medical intervention.  Strep was negative.  Throat culture, COVID-19, flu test pending.  Recommended symptom control with over the counter medications.  Patient sent prescription for cough.  Return if symptoms fail to improve in 1-2 weeks  or you develop shortness of breath, chest pain, severe headache. Patient states understanding and is agreeable.  Discharged with PCP followup.  Final Clinical Impressions(s) / UC Diagnoses   Final diagnoses:  Viral upper respiratory tract infection with cough  Sore throat     Discharge Instructions      It appears that you have a viral upper respiratory infection that should run its course and self resolve in the next few days with symptomatic treatment.  Rapid strep was negative.  Throat culture, COVID test, flu test are pending.  We will call if they are positive.  Please follow-up if symptoms persist or worsen.    ED Prescriptions     Medication Sig Dispense Auth. Provider   benzonatate (TESSALON) 100 MG capsule Take 1 capsule (100 mg total) by mouth every 8 (eight) hours as needed for cough. 21 capsule [place], Michele [place], [place]      PDMP not reviewed this encounter.   Teodora Medici, [place] 05/11/22 [phone]

## 2022-05-11 NOTE — Discharge Instructions (Signed)
It appears that you have a viral upper respiratory infection that should run its course and self resolve in the next few days with symptomatic treatment.  Rapid strep was negative.  Throat culture, COVID test, flu test are pending.  We will call if they are positive.  Please follow-up if symptoms persist or worsen.

## 2022-05-11 NOTE — ED Triage Notes (Signed)
Patient c/o bilateral ear pain, sore throat, headache, congestion, cough x 1 day.  Patient started running a low grade fever yesterday of 99.6 and has started Ibuprofen.

## 2022-05-13 LAB — COVID-19, FLU A+B NAA
Influenza A, NAA: NOT DETECTED
Influenza B, NAA: NOT DETECTED
SARS-CoV-2, NAA: NOT DETECTED

## 2022-05-14 LAB — CULTURE, GROUP A STREP (THRC)

## 2022-07-10 ENCOUNTER — Other Ambulatory Visit: Payer: Self-pay

## 2022-07-10 ENCOUNTER — Encounter: Payer: Self-pay | Admitting: Emergency Medicine

## 2022-07-10 ENCOUNTER — Emergency Department: Payer: 59

## 2022-07-10 ENCOUNTER — Emergency Department
Admission: EM | Admit: 2022-07-10 | Discharge: 2022-07-10 | Disposition: A | Discharge status: Home / Self Care | Payer: 59 | Attending: Emergency Medicine | Admitting: Emergency Medicine

## 2022-07-10 DIAGNOSIS — F17200 Nicotine dependence, unspecified, uncomplicated: Secondary | ICD-10-CM | POA: Diagnosis not present

## 2022-07-10 DIAGNOSIS — R202 Paresthesia of skin: Secondary | ICD-10-CM

## 2022-07-10 DIAGNOSIS — E876 Hypokalemia: Secondary | ICD-10-CM

## 2022-07-10 DIAGNOSIS — R079 Chest pain, unspecified: Secondary | ICD-10-CM

## 2022-07-10 DIAGNOSIS — R519 Headache, unspecified: Secondary | ICD-10-CM

## 2022-07-10 DIAGNOSIS — Z720 Tobacco use: Secondary | ICD-10-CM

## 2022-07-10 DIAGNOSIS — R110 Nausea: Secondary | ICD-10-CM | POA: Diagnosis not present

## 2022-07-10 LAB — CBC
HCT: 37.5 % (ref 36.0–46.0)
Hemoglobin: 12.2 g/dL (ref 12.0–15.0)
MCH: 28.2 pg (ref 26.0–34.0)
MCHC: 32.5 g/dL (ref 30.0–36.0)
MCV: 86.6 fL (ref 80.0–100.0)
Platelets: 338 10*3/uL (ref 150–400)
RBC: 4.33 MIL/uL (ref 3.87–5.11)
RDW: 14.2 % (ref 11.5–15.5)
WBC: 6.4 10*3/uL (ref 4.0–10.5)
nRBC: 0 % (ref 0.0–0.2)

## 2022-07-10 LAB — BASIC METABOLIC PANEL
Anion gap: 5 (ref 5–15)
BUN: 13 mg/dL (ref 6–20)
CO2: 29 mmol/L (ref 22–32)
Calcium: 8.6 mg/dL — ABNORMAL LOW (ref 8.9–10.3)
Chloride: 106 mmol/L (ref 98–111)
Creatinine, Ser: 0.88 mg/dL (ref 0.44–1.00)
GFR, Estimated: >60 mL/min (ref >60)
Glucose, Bld: 106 mg/dL — ABNORMAL HIGH (ref 70–99)
Potassium: 2.6 mmol/L — CL (ref 3.5–5.1)
Sodium: 140 mmol/L (ref 135–145)

## 2022-07-10 LAB — TROPONIN I (HIGH SENSITIVITY)
Troponin I (High Sensitivity): 3 ng/L (ref <18)
Troponin I (High Sensitivity): 3 ng/L (ref <18)

## 2022-07-10 LAB — D-DIMER, QUANTITATIVE: D-Dimer, Quant: 0.41 ug/mL-FEU (ref 0.00–0.50)

## 2022-07-10 LAB — HCG, QUANTITATIVE, PREGNANCY: hCG, Beta Chain, Quant, S: 1 m[IU]/mL (ref <5)

## 2022-07-10 LAB — MAGNESIUM: Magnesium: 1.9 mg/dL (ref 1.7–2.4)

## 2022-07-10 MED ORDER — POTASSIUM CHLORIDE CRYS ER 20 MEQ PO TBCR
40.0000 meq | EXTENDED_RELEASE_TABLET | Freq: Once | ORAL | Status: AC
  Administered 2022-07-10: 40 meq via ORAL
  Filled 2022-07-10: qty 2

## 2022-07-10 MED ORDER — ONDANSETRON HCL 4 MG/2ML IJ SOLN
4.0000 mg | Freq: Once | INTRAMUSCULAR | Status: AC | PRN
  Administered 2022-07-10: 4 mg via INTRAVENOUS
  Filled 2022-07-10: qty 2

## 2022-07-10 MED ORDER — ACETAMINOPHEN 500 MG PO TABS
1000.0000 mg | ORAL_TABLET | Freq: Once | ORAL | Status: AC
  Administered 2022-07-10: 1000 mg via ORAL
  Filled 2022-07-10: qty 2

## 2022-07-10 MED ORDER — POTASSIUM CHLORIDE CRYS ER 20 MEQ PO TBCR: 20.0000 meq | EXTENDED_RELEASE_TABLET | Freq: Two times a day (BID) | ORAL | 0 refills | Status: AC

## 2022-07-10 MED ORDER — POTASSIUM CHLORIDE 10 MEQ/100ML IV SOLN
10.0000 meq | Freq: Once | INTRAVENOUS | Status: AC
  Administered 2022-07-10: 10 meq via INTRAVENOUS
  Filled 2022-07-10: qty 100

## 2022-07-10 MED ORDER — MAGNESIUM SULFATE 2 GM/50ML IV SOLN
2.0000 g | Freq: Once | INTRAVENOUS | Status: AC
  Administered 2022-07-10: 2 g via INTRAVENOUS
  Filled 2022-07-10: qty 50

## 2022-07-10 MED ORDER — KETOROLAC TROMETHAMINE 30 MG/ML IJ SOLN
15.0000 mg | Freq: Once | INTRAMUSCULAR | Status: AC
  Administered 2022-07-10: 15 mg via INTRAVENOUS
  Filled 2022-07-10: qty 1

## 2022-07-10 MED ORDER — LACTATED RINGERS IV BOLUS
1000.0000 mL | Freq: Once | INTRAVENOUS | Status: AC
  Administered 2022-07-10: 1000 mL via INTRAVENOUS

## 2022-07-10 NOTE — ED Triage Notes (Signed)
Pt here with facial numbness and CP that started today. Pt states she woke up to her face being numb and then her chest began to hurt shortly after. Pt states her cp is central and radiates to both arms. Pt having nausea as well.

## 2022-07-10 NOTE — ED Provider Notes (Signed)
[facility] Provider Note    Event Date/Time   First MD Initiated Contact with Patient 07/10/22 1450     (approximate)   History   Numbness and Chest Pain   HPI  Sarah Steele is a 41 y.o. female with a past medical history of chronic back pain, arthritis, tobacco abuse, and some chronic fluid retention on chlorthalidone presents for evaluation of some tingling and paresthesias of both sides of her face and arm that started earlier this morning.  She states she also developed some chest pressure and nausea and headache.  She states she has a headache that is very similar to prior headaches and it came on gradually.  No history of recent trauma or injuries.  No fevers, cough, shortness of breath, vomiting, diarrhea, abdominal pain, back pain, rash or any focal extremity weakness numbness or other acute sick symptoms.  She denies any illicit drug use or EtOH use.    Past Medical History:  Diagnosis Date   Arthritis    Back pain    DDD (degenerative disc disease)    Heart murmur      Physical Exam  Triage Vital Signs: ED Triage Vitals [07/10/22 1409]  Enc Vitals Group     BP 134/78     Pulse Rate 81     Resp 18     Temp 98.3 F (36.8 C)     Temp Source Oral     SpO2 97 %     Weight 273 lb (123.8 kg)     Height '5\' 9"'$  (1.753 m)     Head Circumference      Peak Flow      Pain Score 7     Pain Loc      Pain Edu?      Excl. in [place]?     Most recent vital signs: Vitals:   07/10/22 1528 07/10/22 1530  BP: 121/69 94/73  Pulse: 60 (!) 59  Resp: 19 12  Temp:    SpO2: 97% 98%    General: Awake, no distress.  CV:  Good peripheral perfusion.  2+ radial pulses.  Slight systolic murmur. Resp:  Normal effort.  Clear bilaterally. Abd:  No distention.  Soft. Other:  Cranial nerves II through XII grossly intact.  No pronator drift.  No finger dysmetria.  Symmetric 5/5 strength of all extremities.  Sensation intact to light touch in all extremities.       ED Results / Procedures / Treatments  Labs (all labs ordered are listed, but only abnormal results are displayed) Labs Reviewed  BASIC METABOLIC PANEL - Abnormal; Notable for the following components:      Result Value   Potassium 2.6 (*)    Glucose, Bld 106 (*)    Calcium 8.6 (*)    All other components within normal limits  CBC  MAGNESIUM  HCG, QUANTITATIVE, PREGNANCY  D-DIMER, QUANTITATIVE  POC URINE PREG, ED  TROPONIN I (HIGH SENSITIVITY)     EKG  EKG is remarkable sinus rhythm with PVCs, ventricular rate of 88, normal axis, unremarkable intervals with some artifact versus nonspecific change in lead II and lead III and aVF without other clear evidence of acute ischemia or significant arrhythmia.   RADIOLOGY Chest reviewed by myself shows no focal consoidation, effusion, edema, pneumothorax or other clear acute thoracic process. I also reviewed radiology interpretation and agree with findings described.    PROCEDURES:  Critical Care performed: No  .1-3 Lead EKG Interpretation  Performed  by: Lucrezia Starch, MD Authorized by: Lucrezia Starch, MD     Interpretation: normal     ECG rate assessment: normal     Rhythm: sinus rhythm     Ectopy: none     Conduction: normal     The patient is on the cardiac monitor to evaluate for evidence of arrhythmia and/or significant heart rate changes.   MEDICATIONS ORDERED IN ED: Medications  potassium chloride 10 mEq in 100 mL IVPB (10 mEq Intravenous New Bag/Given 07/10/22 1520)  magnesium sulfate IVPB 2 g 50 mL (2 g Intravenous New Bag/Given 07/10/22 1531)  potassium chloride SA (KLOR-CON M) CR tablet 40 mEq (40 mEq Oral Given 07/10/22 1526)  ondansetron (ZOFRAN) injection 4 mg (4 mg Intravenous Given 07/10/22 1518)  ketorolac (TORADOL) 30 MG/ML injection 15 mg (15 mg Intravenous Given 07/10/22 1525)     IMPRESSION / MDM / ASSESSMENT AND PLAN / ED COURSE  I reviewed the triage vital signs and the nursing notes.  Patient's presentation is most consistent with acute presentation with potential threat to life or bodily function.                               Differential diagnosis includes, but is not limited to ACS, PE, electrolyte derangements, arrhythmia, possibly associate with primary headache such as migraine or tension.  Patient has no focal deficits to suggest a CVA and she describes her headache as gradual onset similar to prior I have a low suspicion for [facility], CVT.  Overall clinical picture is not suggestive of a dissection at this time.  EKG is remarkable sinus rhythm with PVCs, ventricular rate of 88, normal axis, unremarkable intervals with some artifact versus nonspecific change in lead II and lead III and aVF without other clear evidence of acute ischemia or significant arrhythmia.  Given nonelevated troponin x2 I have low suspicion for ACS at this time.  D-dimer 0.41 and not suggestive of PE.  hCG is 1.  CBC without leukocytosis or acute anemia.  MP is remarkable for K of 2.6 without any other significant lecture light or metabolic derangements magnesium 1.9.  Chest reviewed by myself shows no focal consoidation, effusion, edema, pneumothorax or other clear acute thoracic process. I also reviewed radiology interpretation and agree with findings described.    Patient ports slight improvement in her headache on reassessment but still has mild headache.  Suspect this may be the setting of a migraine versus tension in the setting of hypokalemia possibly related to her chlorthalidone use.  I advised her to discontinue this until she can follow-up with her PCP and she has an appointment to be seen in 2 days.  Will prescribe short course of oral potassium repletion.  Advised her to have potassium rechecked when she goes for follow-up visit.  Advised to return for any new or worsening of symptoms.  Discharged in stable condition.  Strict return precautions advised and discussed.   FINAL CLINICAL  IMPRESSION(S) / ED DIAGNOSES   Final diagnoses:  Paresthesia  Acute nonintractable headache, unspecified headache type  Chest pain, unspecified type  Hypokalemia     Rx / DC Orders   ED Discharge Orders     None        Note:  This document was prepared using Dragon voice recognition software and may include unintentional dictation errors.   Lucrezia Starch, MD 07/10/22 [phone]

## 2022-07-31 ENCOUNTER — Ambulatory Visit: Payer: 59 | Admitting: Internal Medicine

## 2022-12-12 ENCOUNTER — Emergency Department: Payer: 59

## 2022-12-12 ENCOUNTER — Emergency Department
Admission: EM | Admit: 2022-12-12 | Discharge: 2022-12-12 | Disposition: A | Discharge status: Home / Self Care | Payer: 59 | Attending: Emergency Medicine | Admitting: Emergency Medicine

## 2022-12-12 ENCOUNTER — Encounter: Payer: Self-pay | Admitting: Emergency Medicine

## 2022-12-12 ENCOUNTER — Other Ambulatory Visit: Payer: Self-pay

## 2022-12-12 DIAGNOSIS — R519 Headache, unspecified: Secondary | ICD-10-CM | POA: Diagnosis present

## 2022-12-12 DIAGNOSIS — G43809 Other migraine, not intractable, without status migrainosus: Secondary | ICD-10-CM | POA: Diagnosis not present

## 2022-12-12 LAB — CBC
HCT: 37.2 % (ref 36.0–46.0)
Hemoglobin: 11.8 g/dL — ABNORMAL LOW (ref 12.0–15.0)
MCH: 28 pg (ref 26.0–34.0)
MCHC: 31.7 g/dL (ref 30.0–36.0)
MCV: 88.2 fL (ref 80.0–100.0)
Platelets: 341 10*3/uL (ref 150–400)
RBC: 4.22 MIL/uL (ref 3.87–5.11)
RDW: 14.6 % (ref 11.5–15.5)
WBC: 7.7 10*3/uL (ref 4.0–10.5)
nRBC: 0 % (ref 0.0–0.2)

## 2022-12-12 LAB — BASIC METABOLIC PANEL
Anion gap: 7 (ref 5–15)
BUN: 16 mg/dL (ref 6–20)
CO2: 30 mmol/L (ref 22–32)
Calcium: 9.1 mg/dL (ref 8.9–10.3)
Chloride: 104 mmol/L (ref 98–111)
Creatinine, Ser: 0.81 mg/dL (ref 0.44–1.00)
GFR, Estimated: >60 mL/min (ref >60)
Glucose, Bld: 95 mg/dL (ref 70–99)
Potassium: 3.3 mmol/L — ABNORMAL LOW (ref 3.5–5.1)
Sodium: 141 mmol/L (ref 135–145)

## 2022-12-12 LAB — URINALYSIS, ROUTINE W REFLEX MICROSCOPIC
Bilirubin Urine: NEGATIVE
Glucose, UA: NEGATIVE mg/dL
Hgb urine dipstick: NEGATIVE
Ketones, ur: NEGATIVE mg/dL
Leukocytes,Ua: NEGATIVE
Nitrite: NEGATIVE
Protein, ur: NEGATIVE mg/dL
Specific Gravity, Urine: 1.027 (ref 1.005–1.030)
pH: 5 (ref 5.0–8.0)

## 2022-12-12 LAB — POC URINE PREG, ED: Preg Test, Ur: NEGATIVE

## 2022-12-12 MED ORDER — KETOROLAC TROMETHAMINE 30 MG/ML IJ SOLN
30.0000 mg | Freq: Once | INTRAMUSCULAR | Status: AC
  Administered 2022-12-12: 30 mg via INTRAMUSCULAR
  Filled 2022-12-12: qty 1

## 2022-12-12 MED ORDER — ONDANSETRON 4 MG PO TBDP
4.0000 mg | ORAL_TABLET | Freq: Once | ORAL | Status: AC
  Administered 2022-12-12: 4 mg via ORAL
  Filled 2022-12-12: qty 1

## 2022-12-12 MED ORDER — LORAZEPAM 1 MG PO TABS
1.0000 mg | ORAL_TABLET | Freq: Once | ORAL | Status: AC
  Administered 2022-12-12: 1 mg via ORAL
  Filled 2022-12-12: qty 1

## 2022-12-12 MED ORDER — ONDANSETRON HCL 4 MG PO TABS: 4.0000 mg | ORAL_TABLET | Freq: Every day | ORAL | 1 refills | Status: AC | PRN

## 2022-12-12 NOTE — Discharge Instructions (Addendum)
Take acetaminophen 650 mg and ibuprofen 400 mg every 6 hours for headache.  Take with food. Take Zofran as prescribed, as needed for nausea.  Thank you for choosing [place] for your health care today!  Please see your primary doctor this week for a follow up appointment.   Sometimes, in the early stages of certain disease courses it is difficult to detect in the emergency department evaluation -- so, it is important that you continue to monitor your symptoms and call your doctor right away or return to the emergency department if you develop any new or worsening symptoms.  Please go to the following website to schedule new (and existing) patient appointments:   [email or web address]  If you do not have a primary doctor try calling the following clinics to establish care:  If you have insurance:  [facility] [phone] [place] [address]   [facility]  [phone] [address] [place] [address]   If you do not have insurance:  [facility]  [phone] [address] [place] [address]   The following is another list of primary care offices in the area who are accepting new patients at this time.  Please reach out to one of them directly and let them know you would like to schedule an appointment to follow up on an Emergency Department visit, and/or to establish a new primary care provider (PCP).  There are likely other primary care clinics in the are who are accepting new patients, but this is an excellent place to start:  [place] physician: Dr Lavon Paganini [address] [place], [place] [address] [phone]  [facility] Lead Physician: Dr Steele Sizer [address], [place], [place] [address] [phone]  [place] Physician: Dr Park Liter [address] [place], [place] [address] [phone]  [facility] Lead Physician: Dr Dewaine Oats [place], [place], [place] [address] [phone]  [facility] Physician: Dr Halina Maidens [address] [place], [place] [address] [phone]   It was my pleasure to care for you today.   [place] Brunette Jacelyn Grip, MD

## 2022-12-12 NOTE — ED Provider Notes (Signed)
[facility] Provider Note    Event Date/Time   First MD Initiated Contact with Patient 12/12/22 [phone]     (approximate)   History   Migraine   HPI  Sarah Steele is a 41 y.o. female   Past medical history of back pain, arthritis, migraine headaches who presents to the emergency department with a headache.  Worsening over the last several days.  She also notes several areas of her scalp which she thinks is abnormal including an indentation on her left frontal side and a ridge on the occiput.  No trauma.  No other recent illnesses.  She states intermittent blurry vision and intermittent dizziness along with her headache.    She denies any other acute medical complaints.    History was obtained via the patient. Also reviewed external medical notes including a visit for headache in the emergency department dated 1/23 which resolved with Toradol and Zofran.     Physical Exam   Triage Vital Signs: ED Triage Vitals  Enc Vitals Group     BP 12/12/22 0729 132/73     Pulse Rate 12/12/22 0729 74     Resp 12/12/22 0729 18     Temp 12/12/22 0728 98 F (36.7 C)     Temp Source 12/12/22 0728 Oral     SpO2 12/12/22 0729 97 %     Weight 12/12/22 0728 273 lb (123.8 kg)     Height 12/12/22 0728 '5\' 9"'$  (1.753 m)     Head Circumference --      Peak Flow --      Pain Score 12/12/22 0728 7     Pain Loc --      Pain Edu? --      Excl. in [place]? --     Most recent vital signs: Vitals:   12/12/22 0728 12/12/22 0729  BP:  132/73  Pulse:  74  Resp:  18  Temp: 98 F (36.7 C)   SpO2:  97%    General: Awake, no distress.  CV:  Good peripheral perfusion.  Resp:  Normal effort.  Abd:  No distention.  Other:  Awake alert and oriented.  Neurological exam is benign including no facial asymmetry or dysarthria extraocular movements normal, pupils equal round and reactive, gait is normal, motor or sensory exam normal, no visual cuts, visual acuity intact  she has no obvious  lesions to the scalp the 2 areas that she mentions I cannot palpate a discernible difference to normal scalp architecture   ED Results / Procedures / Treatments   Labs (all labs ordered are listed, but only abnormal results are displayed) Labs Reviewed  BASIC METABOLIC PANEL - Abnormal; Notable for the following components:      Result Value   Potassium 3.3 (*)    All other components within normal limits  CBC - Abnormal; Notable for the following components:   Hemoglobin 11.8 (*)    All other components within normal limits  URINALYSIS, ROUTINE W REFLEX MICROSCOPIC - Abnormal; Notable for the following components:   Color, Urine YELLOW (*)    APPearance HAZY (*)    All other components within normal limits  POC URINE PREG, ED     I reviewed labs and they are notable for a white blood cell count, mildly low potassium 3.3, hemoglobin 11.8  EKG  ED ECG REPORT I, Lucillie Garfinkel, the attending physician, personally viewed and interpreted this ECG.   Date: 12/12/2022  EKG Time: 0733  Rate:  72  Rhythm: normal sinus rhythm  Axis: nl  Intervals:none  ST&T Change: no acute ischemic changes    RADIOLOGY I independently reviewed and interpreted CT scan of the head and see no obvious bleeding or midline shift   PROCEDURES:  Critical Care performed: No  Procedures   MEDICATIONS ORDERED IN ED: Medications  ketorolac (TORADOL) 30 MG/ML injection 30 mg (has no administration in time range)  ondansetron (ZOFRAN-ODT) disintegrating tablet 4 mg (4 mg Oral Given 12/12/22 0800)  LORazepam (ATIVAN) tablet 1 mg (1 mg Oral Given 12/12/22 0759)    Consultants:   IMPRESSION / MDM / ASSESSMENT AND PLAN / ED COURSE  I reviewed the triage vital signs and the nursing notes.                              Differential diagnosis includes, but is not limited to, migraine headache, complex migraine headache, tension type headache, cluster headache, considered but less likely CVA or  intracranial bleeding, malignancy, mass effect, intracranial pressure increase    MDM: Patient with history of migraines and headache today which is typically responsive to medications.  Will obtain CT scan of the head for acute intracranial pathologies given severity of headache and change from prior migraine patterns.  CT scan negative.  Patient stable.  Treat migraine with Toradol, Zofran since this worked in the past.  DC with return precautions PMD follow-up    Patient's presentation is most consistent with acute presentation with potential threat to life or bodily function.       FINAL CLINICAL IMPRESSION(S) / ED DIAGNOSES   Final diagnoses:  Other migraine without status migrainosus, not intractable     Rx / DC Orders   ED Discharge Orders          Ordered    ondansetron (ZOFRAN) 4 MG tablet  Daily PRN        12/12/22 9169             Note:  This document was prepared using Dragon voice recognition software and may include unintentional dictation errors.    Lucillie Garfinkel, MD 12/12/22 [phone]

## 2022-12-12 NOTE — ED Notes (Signed)
See triage note  Presents with headache for 1 week  Slight nausea Denies any fever,trauma or vomiting   States she has tried OTC meds with no relief

## 2022-12-12 NOTE — ED Triage Notes (Signed)
Patient arrives ambulatory by POV c/o having more frequent migraines. Reports increased pressure to head. C/o a knot to back of head and an indention to front of head causing her to have vision problems and dizziness.

## 2023-07-14 ENCOUNTER — Telehealth: Payer: Self-pay | Admitting: *Deleted

## 2023-07-14 ENCOUNTER — Encounter: Payer: Self-pay | Admitting: *Deleted

## 2023-07-14 ENCOUNTER — Ambulatory Visit
Admission: EM | Admit: 2023-07-14 | Discharge: 2023-07-14 | Disposition: A | Discharge status: Home / Self Care | Payer: 59 | Attending: Internal Medicine | Admitting: Internal Medicine

## 2023-07-14 ENCOUNTER — Other Ambulatory Visit: Payer: Self-pay

## 2023-07-14 DIAGNOSIS — F17210 Nicotine dependence, cigarettes, uncomplicated: Secondary | ICD-10-CM | POA: Insufficient documentation

## 2023-07-14 DIAGNOSIS — Z881 Allergy status to other antibiotic agents status: Secondary | ICD-10-CM | POA: Insufficient documentation

## 2023-07-14 DIAGNOSIS — Z880 Allergy status to penicillin: Secondary | ICD-10-CM | POA: Insufficient documentation

## 2023-07-14 DIAGNOSIS — J029 Acute pharyngitis, unspecified: Secondary | ICD-10-CM | POA: Diagnosis present

## 2023-07-14 DIAGNOSIS — J069 Acute upper respiratory infection, unspecified: Secondary | ICD-10-CM | POA: Diagnosis present

## 2023-07-14 DIAGNOSIS — Z888 Allergy status to other drugs, medicaments and biological substances status: Secondary | ICD-10-CM | POA: Diagnosis not present

## 2023-07-14 DIAGNOSIS — Z882 Allergy status to sulfonamides status: Secondary | ICD-10-CM | POA: Insufficient documentation

## 2023-07-14 DIAGNOSIS — H9203 Otalgia, bilateral: Secondary | ICD-10-CM | POA: Diagnosis present

## 2023-07-14 HISTORY — DX: Migraine, unspecified, not intractable, without status migrainosus: G43.909

## 2023-07-14 HISTORY — DX: Essential (primary) hypertension: I10

## 2023-07-14 LAB — POCT RAPID STREP A (OFFICE): Rapid Strep A Screen: NEGATIVE

## 2023-07-14 LAB — POCT INFLUENZA A/B
Influenza A, POC: NEGATIVE
Influenza B, POC: NEGATIVE

## 2023-07-14 MED ORDER — KETOROLAC TROMETHAMINE 30 MG/ML IJ SOLN
30.0000 mg | Freq: Once | INTRAMUSCULAR | Status: AC
  Administered 2023-07-14: 30 mg via INTRAMUSCULAR

## 2023-07-14 MED ORDER — AZELASTINE HCL 0.1 % NA SOLN: 1.0000 | Freq: Two times a day (BID) | NASAL | 0 refills | Status: AC

## 2023-07-14 MED ORDER — BENZONATATE 100 MG PO CAPS: 100.0000 mg | ORAL_CAPSULE | Freq: Three times a day (TID) | ORAL | 0 refills | Status: AC | PRN

## 2023-07-14 NOTE — Telephone Encounter (Signed)
Pt left prior to staff obtaining SARS specimen. Attempt to call pt without success. Left VM requesting return call.

## 2023-07-14 NOTE — ED Triage Notes (Signed)
C/O starting with sore throat and bilat ear pain onset 2 days ago. Since then, c/o feeling like she's going to pass out if I stand up for long", chills, occasional cough, HA, describes swollen lymph nodes behind bilat ears. Has been taking Nyquil, sumatriptan, Benadryl, IBU.

## 2023-07-14 NOTE — Discharge Instructions (Signed)
Rapid strep and rapid flu were negative.  Throat culture and COVID test are pending.  We will call if they are abnormal.  As we discussed, it appears that you have a viral illness that should run its course.  Ensure that you are drinking plenty of fluids.  You were given a Toradol shot today in urgent care.  Do not take any ibuprofen, Advil, Aleve for at least 24 hours following injection.  I have prescribed you 2 medications to help alleviate symptoms as well.  Follow-up if any symptoms persist or worsen.

## 2023-07-14 NOTE — ED Provider Notes (Signed)
[facility]    CSN: [id number] Arrival date & time: 07/14/23  5284      History   Chief Complaint No chief complaint on file.   HPI Sarah Steele is a 42 y.o. female.   Patient presents with sore throat, bilateral ear pain, concerns for lymph node swelling, nasal congestion, mild cough that started about 2 days ago.  Denies any documented fever but reports that she felt feverish and had chills.  Denies any known sick contacts.  Denies chest pain or shortness of breath.  Reports that she has felt faint if she stands up for too long but has not had any episodes of syncope.  Also reporting a headache.  She has taken sumatriptan for headache with no improvement as she does have history of migraines.  Has also taken DayQuil and NyQuil with minimal improvement.  Denies history of asthma or COPD but patient does smoke cigarettes.     Past Medical History:  Diagnosis Date   Arthritis    Back pain    DDD (degenerative disc disease)    Heart murmur    Hypertension    Migraines     There are no problems to display for this patient.   History reviewed. No pertinent surgical history.  OB History   No obstetric history on file.      Home Medications    Prior to Admission medications   Medication Sig Start Date End Date Taking? Authorizing Provider  azelastine (ASTELIN) 0.1 % nasal spray Place 1 spray into both nostrils 2 (two) times daily. Use in each nostril as directed 07/14/23  Yes Enjoli Tidd, [place] E, FNP  benzonatate (TESSALON) 100 MG capsule Take 1 capsule (100 mg total) by mouth every 8 (eight) hours as needed for cough. 07/14/23  Yes Christain Mcraney, Rolly [place] E, FNP  HYDROCHLOROTHIAZIDE PO Take by mouth.   Yes [provider]  loratadine (CLARITIN) 10 MG tablet Take 10 mg by mouth daily.   Yes [provider]  LOSARTAN POTASSIUM PO Take by mouth.   Yes [provider]  oxyCODONE-acetaminophen (PERCOCET/ROXICET) 5-325 MG tablet Take by mouth 3 (three)  times daily.   Yes [provider]  SUMATRIPTAN SUCCINATE PO Take by mouth.   Yes [provider]  tiZANidine (ZANAFLEX) 2 MG tablet Take 2 mg by mouth at bedtime as needed. 07/24/20  Yes [provider]  ibuprofen (ADVIL) 200 MG tablet Take 200 mg by mouth every 6 (six) hours as needed.    [provider]  ondansetron (ZOFRAN) 4 MG tablet Take 1 tablet (4 mg total) by mouth daily as needed for nausea or vomiting. 12/12/22 12/12/23  Pilar Jarvis, MD  potassium chloride SA (KLOR-CON M) 20 MEQ tablet Take 1 tablet (20 mEq total) by mouth 2 (two) times daily for 2 days. 07/10/22 07/12/22  Gilles Chiquito, MD    Family History History reviewed. No pertinent family history.  Social History Social History   Tobacco Use   Smoking status: Every Day    Current packs/day: 0.25    Types: Cigarettes   Smokeless tobacco: Never  Vaping Use   Vaping status: Never Used  Substance Use Topics   Alcohol use: Not Currently   Drug use: No     Allergies   Aleve cold & [pseudoephedrine-naproxen na er], Amoxicillin, Chloraseptic sore throat [acetaminophen], Clindamycin/lincomycin, Doxycycline, Flagyl [metronidazole hcl], Influenza vaccines, Neurontin [gabapentin], Nubain [nalbuphine hcl], Penicillins, Phenergan [promethazine hcl], Prednisone, Septra [bactrim], and Ultram [tramadol hcl]   Review  of Systems Review of Systems Per HPI  Physical Exam Triage Vital Signs ED Triage Vitals  Encounter Vitals Group     BP 07/14/23 0840 133/74     Systolic BP Percentile --      Diastolic BP Percentile --      Pulse Rate 07/14/23 0840 79     Resp 07/14/23 0840 16     Temp 07/14/23 0840 98.7 F (37.1 C)     Temp Source 07/14/23 0840 Oral     SpO2 07/14/23 0840 94 %     Weight --      Height --      Head Circumference --      Peak Flow --      Pain Score 07/14/23 0842 8     Pain Loc --      Pain Education --      Exclude from Growth Chart --    No data  found.  Updated Vital Signs BP 133/74   Pulse 79   Temp 98.7 F (37.1 C) (Oral)   Resp 16   LMP 06/25/2023 (Exact Date)   SpO2 94%   Visual Acuity Right Eye Distance:   Left Eye Distance:   Bilateral Distance:    Right Eye Near:   Left Eye Near:    Bilateral Near:     Physical Exam Constitutional:      General: She is not in acute distress.    Appearance: Normal appearance. She is not toxic-appearing or diaphoretic.  HENT:     Head: Normocephalic and atraumatic.     Right Ear: Ear canal normal. A middle ear effusion is present. Tympanic membrane is not perforated, erythematous or bulging.     Left Ear: Ear canal normal. A middle ear effusion is present. Tympanic membrane is not perforated, erythematous or bulging.     Nose: Congestion present.     Mouth/Throat:     Mouth: Mucous membranes are moist.     Pharynx: Posterior oropharyngeal erythema present.  Eyes:     Extraocular Movements: Extraocular movements intact.     Conjunctiva/sclera: Conjunctivae normal.     Pupils: Pupils are equal, round, and reactive to light.  Cardiovascular:     Rate and Rhythm: Normal rate and regular rhythm.     Pulses: Normal pulses.     Heart sounds: Normal heart sounds.  Pulmonary:     Effort: Pulmonary effort is normal. No respiratory distress.     Breath sounds: Normal breath sounds. No wheezing.  Abdominal:     General: Abdomen is flat. Bowel sounds are normal.     Palpations: Abdomen is soft.  Musculoskeletal:        General: Normal range of motion.     Cervical back: Normal range of motion.  Lymphadenopathy:     Head:     Right side of head: No occipital adenopathy.     Left side of head: No occipital adenopathy.     Cervical: No cervical adenopathy.     Comments: No obvious lymph node swelling noted.  Skin:    General: Skin is warm and dry.  Neurological:     General: No focal deficit present.     Mental Status: She is alert and oriented to person, place, and time.  Mental status is at baseline.  Psychiatric:        Mood and Affect: Mood normal.        Behavior: Behavior normal.      UC Treatments /  Results  Labs (all labs ordered are listed, but only abnormal results are displayed) Labs Reviewed  CULTURE, GROUP A STREP ([facility])  SARS CORONAVIRUS 2 (TAT 6-24 HRS)  POCT RAPID STREP A (OFFICE)  POCT INFLUENZA A/B    EKG   Radiology No results found.  Procedures Procedures (including critical care time)  Medications Ordered in UC Medications  ketorolac (TORADOL) 30 MG/ML injection 30 mg (30 mg Intramuscular Given 07/14/23 0919)    Initial Impression / Assessment and Plan / UC Course  I have reviewed the triage vital signs and the nursing notes.  Pertinent labs & imaging results that were available during my care of the patient were reviewed by me and considered in my medical decision making (see chart for details).     Patient presents with symptoms likely from a viral upper respiratory infection. Do not suspect underlying cardiopulmonary process. Symptoms seem unlikely related to ACS, CHF or COPD exacerbations, pneumonia, pneumothorax. Patient is nontoxic appearing and not in need of emergent medical intervention.  Strep and rapid flu are negative.  Throat culture and COVID test pending.  Highly suspect the patient's episodes of "feeling faint" are due to fluid behind eardrums.  Recommended symptom control with medications and supportive care.  IM Toradol administered in urgent care to help alleviate headache and inflammation.  Advise no NSAIDs for at least 24 hours following injection.  Return if symptoms fail to improve in 1-2 weeks or you develop shortness of breath, chest pain, severe headache. Patient states understanding and is agreeable.  Discharged with PCP followup.  Final Clinical Impressions(s) / UC Diagnoses   Final diagnoses:  Sore throat  Viral upper respiratory tract infection with cough     Discharge Instructions       Rapid strep and rapid flu were negative.  Throat culture and COVID test are pending.  We will call if they are abnormal.  As we discussed, it appears that you have a viral illness that should run its course.  Ensure that you are drinking plenty of fluids.  You were given a Toradol shot today in urgent care.  Do not take any ibuprofen, Advil, Aleve for at least 24 hours following injection.  I have prescribed you 2 medications to help alleviate symptoms as well.  Follow-up if any symptoms persist or worsen.     ED Prescriptions     Medication Sig Dispense Auth. Provider   benzonatate (TESSALON) 100 MG capsule Take 1 capsule (100 mg total) by mouth every 8 (eight) hours as needed for cough. 21 capsule [place], [place] E, [place]   azelastine (ASTELIN) 0.1 % nasal spray Place 1 spray into both nostrils 2 (two) times daily. Use in each nostril as directed 30 mL Gustavus Bryant, [place]      PDMP not reviewed this encounter.   Gustavus Bryant, [place] 07/14/23 [phone]

## 2023-07-14 NOTE — Telephone Encounter (Signed)
Attempt to reach pt again, as SARS specimen wasn't obtained prior to pt discharge this AM; will offer pt to return for SARS test if she wishes. No answer.

## 2023-07-15 LAB — CULTURE, GROUP A STREP (THRC)

## 2023-07-16 LAB — CULTURE, GROUP A STREP (THRC)

## 2023-09-09 ENCOUNTER — Other Ambulatory Visit: Payer: Self-pay | Admitting: Nurse Practitioner

## 2023-09-09 DIAGNOSIS — Z1231 Encounter for screening mammogram for malignant neoplasm of breast: Secondary | ICD-10-CM

## 2023-12-11 ENCOUNTER — Ambulatory Visit
Admission: RE | Admit: 2023-12-11 | Discharge: 2023-12-11 | Disposition: A | Discharge status: Home / Self Care | Payer: 59 | Source: Ambulatory Visit | Attending: Nurse Practitioner | Admitting: Nurse Practitioner

## 2023-12-11 DIAGNOSIS — Z1231 Encounter for screening mammogram for malignant neoplasm of breast: Secondary | ICD-10-CM | POA: Diagnosis present

## 2024-07-25 ENCOUNTER — Ambulatory Visit: Admission: EM | Admit: 2024-07-25 | Discharge: 2024-07-25 | Disposition: A | Discharge status: Home / Self Care

## 2024-07-25 DIAGNOSIS — M5441 Lumbago with sciatica, right side: Secondary | ICD-10-CM

## 2024-07-25 DIAGNOSIS — I4891 Unspecified atrial fibrillation: Secondary | ICD-10-CM | POA: Insufficient documentation

## 2024-07-25 DIAGNOSIS — R011 Cardiac murmur, unspecified: Secondary | ICD-10-CM | POA: Insufficient documentation

## 2024-07-25 HISTORY — DX: Cardiac arrhythmia, unspecified: I49.9

## 2024-07-25 MED ORDER — KETOROLAC TROMETHAMINE 30 MG/ML IJ SOLN
30.0000 mg | Freq: Once | INTRAMUSCULAR | Status: AC
  Administered 2024-07-25: 30 mg via INTRAMUSCULAR

## 2024-07-25 NOTE — ED Provider Notes (Signed)
 [facility]    CSN: [id number] Arrival date & time: 07/25/24  0801      History   Chief Complaint Chief Complaint  Patient presents with   Back Pain    HPI Sarah Steele is a 43 y.o. female.   Patient here today for evaluation of low back pain. She reports chronic back pain but she has acute flares at times. She reports that her current pain is present to her right low back. She does note that it will radiate down her right leg at times, but this is typical for her. She has taken her typical treatment medication without resolution. She denies any new injury. She has not had any loss of bowel or bladder function. She has had some numbness and tingling to her right leg which is normal for her with her sciatica flares. She notes in the past she has been given an injection of toradol  with some improvement.   The history is provided by the patient.  Back Pain Associated symptoms: no abdominal pain and no fever     Past Medical History:  Diagnosis Date   Arrhythmia    Arthritis    Back pain    DDD (degenerative disc disease)    Heart murmur    Hypertension    Migraines     Patient Active Problem List   Diagnosis Date Noted   A-fib (HCC) 07/25/2024   Heart murmur 07/25/2024    History reviewed. No pertinent surgical history.  OB History   No obstetric history on file.      Home Medications    Prior to Admission medications   Medication Sig Start Date End Date Taking? Authorizing Provider  amitriptyline (ELAVIL) 10 MG tablet Take 10 mg by mouth at bedtime. 07/16/24  Yes [provider]  azithromycin (ZITHROMAX) 250 MG tablet Take 250 mg by mouth as directed. 05/01/24  Yes [provider]  chlorthalidone  (HYGROTON ) 25 MG tablet Take 25 mg by mouth daily. 06/16/24  Yes [provider]  ciprofloxacin  (CIPRO ) 250 MG tablet Take 250 mg by mouth as directed. 03/29/24  Yes [provider]  HYDROCHLOROTHIAZIDE PO Take by mouth.   Yes  [provider]  lidocaine  (LIDODERM ) 5 % Place 1 patch onto the skin daily. 06/16/24  Yes [provider]  loratadine (CLARITIN) 10 MG tablet Take 10 mg by mouth daily.   Yes [provider]  LOSARTAN  POTASSIUM PO Take 10 mg by mouth daily at 2 PM.   Yes [provider]  methocarbamol  (ROBAXIN ) 500 MG tablet Take 500 mg by mouth 2 (two) times daily as needed. 06/16/24  Yes [provider]  ondansetron  (ZOFRAN -ODT) 4 MG disintegrating tablet Take 4 mg by mouth every 6 (six) hours as needed. 04/11/24  Yes [provider]  oxyCODONE -acetaminophen  (PERCOCET/ROXICET) 5-325 MG tablet Take by mouth 3 (three) times daily.   Yes [provider]  UNKNOWN TO PATIENT Lipid reducing medication.   Yes [provider]  Vitamin D, Ergocalciferol, (DRISDOL) 1.25 MG (50000 UNIT) CAPS capsule Take 50,000 Units by mouth once a week. 06/16/24  Yes [provider]  azelastine  (ASTELIN ) 0.1 % nasal spray Place 1 spray into both nostrils 2 (two) times daily. Use in each nostril as directed 07/14/23   Hazen Darryle BRAVO, FNP  benzonatate  (TESSALON ) 100 MG capsule Take 1 capsule (100 mg total) by mouth every 8 (eight) hours as needed for cough. 07/14/23   [place], [place]aley E, FNP  ibuprofen (ADVIL)  200 MG tablet Take 200 mg by mouth every 6 (six) hours as needed.    [provider]  potassium chloride  SA (KLOR-CON  M) 20 MEQ tablet Take 1 tablet (20 mEq total) by mouth 2 (two) times daily for 2 days. 07/10/22 07/12/22  Claudene Arthea SQUIBB, MD  SUMATRIPTAN  SUCCINATE PO Take by mouth.    [provider]  tiZANidine  (ZANAFLEX ) 2 MG tablet Take 2 mg by mouth at bedtime as needed. 07/24/20   [provider]    Family History History reviewed. No pertinent family history.  Social History Social History   Tobacco Use   Smoking status: Every Day    Current packs/day: 0.25    Types: Cigarettes   Smokeless tobacco: Never  Vaping Use    Vaping status: Never Used  Substance Use Topics   Alcohol use: Not Currently   Drug use: No     Allergies   Aleve cold & [pseudoephedrine-naproxen na er], Amoxicillin, Chloraseptic sore throat [acetaminophen ], Clindamycin/lincomycin, Doxycycline, Flagyl  [metronidazole  hcl], Influenza vaccines, Neurontin [gabapentin], Nubain [nalbuphine hcl], Penicillins, Phenergan [promethazine hcl], Prednisone, Septra [bactrim], and Ultram [tramadol hcl]   Review of Systems Review of Systems  Constitutional:  Negative for chills and fever.  Eyes:  Negative for discharge and redness.  Respiratory:  Negative for shortness of breath.   Gastrointestinal:  Negative for abdominal pain, nausea and vomiting.  Musculoskeletal:  Positive for back pain and myalgias.     Physical Exam Triage Vital Signs ED Triage Vitals  Encounter Vitals Group     BP 07/25/24 0816 130/84     Girls Systolic BP Percentile --      Girls Diastolic BP Percentile --      Boys Systolic BP Percentile --      Boys Diastolic BP Percentile --      Pulse Rate 07/25/24 0816 88     Resp 07/25/24 0816 18     Temp 07/25/24 0816 98.8 F (37.1 C)     Temp Source 07/25/24 0816 Oral     SpO2 07/25/24 0816 96 %     Weight 07/25/24 0811 278 lb (126.1 kg)     Height 07/25/24 0811 5' 9 (1.753 m)     Head Circumference --      Peak Flow --      Pain Score 07/25/24 0809 0     Pain Loc --      Pain Education --      Exclude from Growth Chart --    No data found.  Updated Vital Signs BP 130/84 (BP Location: Left Arm)   Pulse 88   Temp 98.8 F (37.1 C) (Oral)   Resp 18   Ht 5' 9 (1.753 m)   Wt 278 lb (126.1 kg)   LMP 06/22/2024 (Approximate)   SpO2 96%   BMI 41.05 kg/m   Visual Acuity Right Eye Distance:   Left Eye Distance:   Bilateral Distance:    Right Eye Near:   Left Eye Near:    Bilateral Near:     Physical Exam Vitals and nursing note reviewed.  Constitutional:      General: She is not in acute distress.     Appearance: Normal appearance. She is not ill-appearing.  HENT:     Head: Normocephalic and atraumatic.  Eyes:     Conjunctiva/sclera: Conjunctivae normal.  Cardiovascular:     Rate and Rhythm: Normal rate.  Pulmonary:     Effort: Pulmonary effort is normal. No respiratory distress.  Musculoskeletal:     Comments: Mild TTP diffusely to midline thoracic and lumbar spine, across low back  Neurological:     Mental Status: She is alert.  Psychiatric:        Mood and Affect: Mood normal.        Behavior: Behavior normal.        Thought Content: Thought content normal.      UC Treatments / Results  Labs (all labs ordered are listed, but only abnormal results are displayed) Labs Reviewed - No data to display  EKG   Radiology No results found.  Procedures Procedures (including critical care time)  Medications Ordered in UC Medications  ketorolac  (TORADOL ) 30 MG/ML injection 30 mg (30 mg Intramuscular Given 07/25/24 0834)    Initial Impression / Assessment and Plan / UC Course  I have reviewed the triage vital signs and the nursing notes.  Pertinent labs & imaging results that were available during my care of the patient were reviewed by me and considered in my medical decision making (see chart for details).    Toradol  injection administered in office as this has helped in the past. Encouraged follow up with any further concerns.   Final Clinical Impressions(s) / UC Diagnoses   Final diagnoses:  Acute right-sided low back pain with right-sided sciatica   Discharge Instructions   None    ED Prescriptions   None    PDMP not reviewed this encounter.   Billy Asberry FALCON, PA-C 07/25/24 [phone]

## 2024-07-25 NOTE — ED Triage Notes (Signed)
 I am having some right lower back pain, I deal with it every day but last few days it is real bad and deep down in their, I go to pain management but the last time I was in here they gave me a shot and it worked well.

## 2024-08-24 ENCOUNTER — Emergency Department (HOSPITAL_BASED_OUTPATIENT_CLINIC_OR_DEPARTMENT_OTHER)

## 2024-08-24 ENCOUNTER — Emergency Department (HOSPITAL_BASED_OUTPATIENT_CLINIC_OR_DEPARTMENT_OTHER)
Admission: EM | Admit: 2024-08-24 | Discharge: 2024-08-25 | Disposition: A | Discharge status: Home / Self Care | Attending: Emergency Medicine | Admitting: Emergency Medicine

## 2024-08-24 ENCOUNTER — Encounter (HOSPITAL_BASED_OUTPATIENT_CLINIC_OR_DEPARTMENT_OTHER): Payer: Self-pay

## 2024-08-24 ENCOUNTER — Other Ambulatory Visit: Payer: Self-pay

## 2024-08-24 DIAGNOSIS — I4891 Unspecified atrial fibrillation: Secondary | ICD-10-CM | POA: Diagnosis not present

## 2024-08-24 DIAGNOSIS — Z79899 Other long term (current) drug therapy: Secondary | ICD-10-CM | POA: Diagnosis not present

## 2024-08-24 DIAGNOSIS — Z7901 Long term (current) use of anticoagulants: Secondary | ICD-10-CM | POA: Diagnosis not present

## 2024-08-24 DIAGNOSIS — I10 Essential (primary) hypertension: Secondary | ICD-10-CM | POA: Insufficient documentation

## 2024-08-24 DIAGNOSIS — G43909 Migraine, unspecified, not intractable, without status migrainosus: Secondary | ICD-10-CM | POA: Diagnosis present

## 2024-08-24 LAB — CBC WITH DIFFERENTIAL/PLATELET
Abs Immature Granulocytes: 0.01 K/uL (ref 0.00–0.07)
Basophils Absolute: 0 K/uL (ref 0.0–0.1)
Basophils Relative: 1 %
Eosinophils Absolute: 0.2 K/uL (ref 0.0–0.5)
Eosinophils Relative: 3 %
HCT: 40 % (ref 36.0–46.0)
Hemoglobin: 13 g/dL (ref 12.0–15.0)
Immature Granulocytes: 0 %
Lymphocytes Relative: 28 %
Lymphs Abs: 1.7 K/uL (ref 0.7–4.0)
MCH: 28 pg (ref 26.0–34.0)
MCHC: 32.5 g/dL (ref 30.0–36.0)
MCV: 86.2 fL (ref 80.0–100.0)
Monocytes Absolute: 0.3 K/uL (ref 0.1–1.0)
Monocytes Relative: 5 %
Neutro Abs: 3.7 K/uL (ref 1.7–7.7)
Neutrophils Relative %: 63 %
Platelets: 304 K/uL (ref 150–400)
RBC: 4.64 MIL/uL (ref 3.87–5.11)
RDW: 14.5 % (ref 11.5–15.5)
WBC: 5.9 K/uL (ref 4.0–10.5)
nRBC: 0 % (ref 0.0–0.2)

## 2024-08-24 LAB — BASIC METABOLIC PANEL WITH GFR
Anion gap: 11 (ref 5–15)
BUN: 11 mg/dL (ref 6–20)
CO2: 24 mmol/L (ref 22–32)
Calcium: 9.3 mg/dL (ref 8.9–10.3)
Chloride: 104 mmol/L (ref 98–111)
Creatinine, Ser: 0.75 mg/dL (ref 0.44–1.00)
GFR, Estimated: >60 mL/min (ref >60)
Glucose, Bld: 95 mg/dL (ref 70–99)
Potassium: 3.9 mmol/L (ref 3.5–5.1)
Sodium: 139 mmol/L (ref 135–145)

## 2024-08-24 MED ORDER — KETOROLAC TROMETHAMINE 30 MG/ML IJ SOLN
30.0000 mg | Freq: Once | INTRAMUSCULAR | Status: AC
  Administered 2024-08-24: 30 mg via INTRAVENOUS
  Filled 2024-08-24: qty 1

## 2024-08-24 MED ORDER — METOCLOPRAMIDE HCL 5 MG/ML IJ SOLN
10.0000 mg | Freq: Once | INTRAMUSCULAR | Status: AC
  Administered 2024-08-24: 10 mg via INTRAVENOUS
  Filled 2024-08-24: qty 2

## 2024-08-24 NOTE — ED Triage Notes (Signed)
 Arrives POV with complaints of posterior head swelling with pain and headache x2 days. No fall or injuries.

## 2024-08-24 NOTE — ED Provider Notes (Signed)
 [place] [facility] DEPARTMENT AT [facility] Provider Note   CSN: [id number] Arrival date & time: 08/24/24  1819     Patient presents with: Head Pain and Headache   Rickey Farrier is a 43 y.o. female w/ hx of afib, migraines, DDD, HTN that pf head pain.   - Starting 2 days ago, she noticed swelling/tightness in the back of her head and back of neck. - Feels like the swelling may be spreading to the top of her head.  - It is uncomfortable for her to move her head in certain directions due to the pain - She take pain meds for her chronic back pain including oxycodone  and ibuprofen. She feels like these are not helping with her neck pain.  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 That ridge on her head feels like it's bigger than before and feels sore to touch during this time. - She also reports blurry vision during this time that comes and goes.  - Denies hitting her head or any trauma. No recent travel. No medication changes.  - She feels nauseous, but no vomiting.  - Able to eat and drink normally.   Headache      Prior to Admission medications   Medication Sig Start Date End Date Taking? Authorizing Provider  amitriptyline (ELAVIL) 10 MG tablet Take 10 mg by mouth at bedtime. 07/16/24   [provider]  azelastine  (ASTELIN ) 0.1 % nasal spray Place 1 spray into both nostrils 2 (two) times daily. Use in each nostril as directed 07/14/23   Hazen Darryle BRAVO, FNP  azithromycin (ZITHROMAX) 250 MG tablet Take 250 mg by mouth as directed. 05/01/24   [provider]  benzonatate  (TESSALON ) 100 MG capsule Take 1 capsule (100 mg total) by mouth every 8 (eight) hours as needed for cough. 07/14/23   Hazen Darryle BRAVO, FNP  chlorthalidone  (HYGROTON ) 25 MG tablet Take 25 mg by mouth daily. 06/16/24   [provider]  ciprofloxacin  (CIPRO ) 250 MG tablet Take 250 mg by mouth as directed. 03/29/24   [provider]   HYDROCHLOROTHIAZIDE PO Take by mouth.    [provider]  ibuprofen (ADVIL) 200 MG tablet Take 200 mg by mouth every 6 (six) hours as needed.    [provider]  lidocaine  (LIDODERM ) 5 % Place 1 patch onto the skin daily. 06/16/24   [provider]  loratadine (CLARITIN) 10 MG tablet Take 10 mg by mouth daily.    [provider]  LOSARTAN  POTASSIUM PO Take 10 mg by mouth daily at 2 PM.    [provider]  methocarbamol  (ROBAXIN ) 500 MG tablet Take 500 mg by mouth 2 (two) times daily as needed. 06/16/24   [provider]  ondansetron  (ZOFRAN -ODT) 4 MG disintegrating tablet Take 4 mg by mouth every 6 (six) hours as needed. 04/11/24   [provider]  oxyCODONE -acetaminophen  (PERCOCET/ROXICET) 5-325 MG tablet Take by mouth 3 (three) times daily.    [provider]  potassium chloride  SA (KLOR-CON  M) 20 MEQ tablet Take 1 tablet (20 mEq total) by mouth 2 (two) times daily for 2 days. 07/10/22 07/12/22  Claudene Arthea SQUIBB, MD  SUMATRIPTAN  SUCCINATE PO Take by mouth.    [provider]  tiZANidine  (ZANAFLEX ) 2 MG tablet Take 2 mg by mouth at bedtime as needed. 07/24/20   [provider]  UNKNOWN TO PATIENT Lipid reducing medication.    [provider]  Vitamin D, Ergocalciferol, (DRISDOL) 1.25 MG (50000 UNIT) CAPS capsule Take 50,000 Units by mouth once a week. 06/16/24   [provider]    Allergies: Aleve cold & [pseudoephedrine-naproxen na er], Amoxicillin, Chloraseptic sore throat [acetaminophen ], Clindamycin/lincomycin, Doxycycline, Flagyl  [metronidazole  hcl], Influenza vaccines, Neurontin [gabapentin], Nubain [nalbuphine hcl], Penicillins, Phenergan [promethazine hcl], Prednisone, Septra [bactrim], and Ultram [tramadol hcl]    Review of Systems  Neurological:  Positive for headaches.    Updated Vital Signs BP 137/86 (BP Location: Left Arm)   Pulse 62   Temp 98.1 F (36.7 C) (Oral)   Resp 16    Ht 5' 9 (1.753 m)   Wt 127 kg   LMP 08/24/2024 (Approximate)   SpO2 97%   BMI 41.35 kg/m   Physical Exam General: Alert, pleasant, nontoxic appearing woman. NAD. Neuro: Regular gait, walking unassisted.  CN2: no vision changes CN3,4,6: PERRLA. EOMI CN5: Sensation intact BL CN7: Facial expressions symmetric CN8: Hearing intact BL CN9: palate symmetric  CN10: regular speech CN11: turns head against resistance CN12: tongue midline  HEENT: NCAT. MMM. Smooth indentation on L sided crown of head (where pt notes she has had the indentation for a long time), mild discomfort to palpation. TTP along R posterior neck, in trapezius distribution. Neck full active ROM. CV: RRR, no murmurs. Cap refill <2. Resp: CTAB, no wheezing or crackles. Normal WOB on RA.  Abm: Soft, nontender, nondistended. BS present. Ext: Moves all ext spontaneously Skin: Warm, well perfused      (all labs ordered are listed, but only abnormal results are displayed) Labs Reviewed  BASIC METABOLIC PANEL WITH GFR  CBC WITH DIFFERENTIAL/PLATELET    EKG: EKG Interpretation Date/Time:  Tuesday August 24 2024 23:28:59 EDT Ventricular Rate:  63 PR Interval:  165 QRS Duration:  100 QT Interval:  437 QTC Calculation: 448 R Axis:   -11  Text Interpretation: Sinus rhythm Low voltage, precordial leads No significant change since last tracing Confirmed by Doretha Folks ([id number]) on 08/24/2024 11:33:58 PM  Radiology: CT Head Wo Contrast Result Date: 08/24/2024 CLINICAL DATA:  Neuro deficit, acute, stroke suspected EXAM: CT HEAD WITHOUT CONTRAST TECHNIQUE: Contiguous axial images were obtained from the base of the skull through the vertex without intravenous contrast. RADIATION DOSE REDUCTION: This exam was performed according to the departmental dose-optimization program which includes automated exposure control, adjustment of the mA and/or kV according to patient size and/or use of iterative reconstruction technique.  COMPARISON:  Head CT 12/12/2022 FINDINGS: Brain: No intracranial hemorrhage, mass effect, or midline shift. No hydrocephalus. The basilar cisterns are patent. No evidence of territorial infarct or acute ischemia. No extra-axial or intracranial fluid collection. Vascular: No hyperdense vessel or unexpected calcification. Skull: No fracture or focal lesion. Sinuses/Orbits: No acute finding. Other: Small lymph nodes in the right occipital subcutaneous tissues, series 2, images 7 and 8. IMPRESSION: 1. No acute intracranial abnormality. 2. Small lymph nodes in the right occipital subcutaneous tissues, unchanged from 2023 exam and likely reactive. Electronically Signed   By: Andrea Gasman M.D.   On: 08/24/2024 23:29    Procedures   Medications Ordered in the ED  ketorolac  (TORADOL ) 30 MG/ML injection 30 mg (30 mg Intravenous Given 08/24/24 2316)  metoCLOPramide  (REGLAN ) injection 10 mg (10 mg Intravenous Given 08/24/24 2316)                                  Medical Decision Making:  Tamila Gaulin is a 43 y.o. female who presented to the ED today with headache detailed above.       Reviewed and confirmed nursing documentation for past medical history, family history, social history.    Initial Assessment:    Differential includes trapezius muscle spasm, migraine headaches, tension headache. CVA, intracranial bleeds less likely given normal neuro exam. Meningitis is also considered, but less likely given lack of fever or other infectious symptoms.   From chart review, it looks like pt had similar ED visit in 11/2022 for headache. She was treated for migraine and improved at that time.   Initial Plan:  Toradol  and reglan  for pain and nausea CT head w/o con to workup intracranial abnormalities Screening labs including CBC and Metabolic panel to evaluate for infectious or metabolic etiology of disease.  EKG to evaluate for cardiac pathology Objective evaluation as below reviewed   Initial Study  Results:   Laboratory  All laboratory results reviewed without evidence of clinically relevant pathology.    EKG EKG was reviewed independently. Rate, rhythm, axis, intervals all examined and without medically relevant abnormality. ST segments without concerns for elevations.    Radiology:  All images reviewed independently. Agree with radiology report at this time.   CT Head Wo Contrast Result Date: 08/24/2024 CLINICAL DATA:  Neuro deficit, acute, stroke suspected EXAM: CT HEAD WITHOUT CONTRAST TECHNIQUE: Contiguous axial images were obtained from the base of the skull through the vertex without intravenous contrast. RADIATION DOSE REDUCTION: This exam was performed according to the departmental dose-optimization program which includes automated exposure control, adjustment of the mA and/or kV according to patient size and/or use of iterative reconstruction technique. COMPARISON:  Head CT 12/12/2022 FINDINGS: Brain: No intracranial hemorrhage, mass effect, or midline shift. No hydrocephalus. The basilar cisterns are patent. No evidence of territorial infarct or acute ischemia. No extra-axial or intracranial fluid collection. Vascular: No hyperdense vessel or unexpected calcification. Skull: No fracture or focal lesion. Sinuses/Orbits: No acute finding. Other: Small lymph nodes in the right occipital subcutaneous tissues, series 2, images 7 and 8. IMPRESSION: 1. No acute intracranial abnormality. 2. Small lymph nodes in the right occipital subcutaneous tissues, unchanged from 2023 exam and likely reactive. Electronically Signed   By: Andrea Gasman M.D.   On: 08/24/2024 23:29      Reassessment and Plan:    2346: Pt symptoms improving with reglan  and toradol . CT head reassuring. Leading differential is migraine, and muscle spasm could also be contributing. Pt is safe to discharge with close PCP f/u. Work note provided.       Final diagnoses:  Migraine without status migrainosus, not intractable,  unspecified migraine type    ED Discharge Orders     None          Elicia Hamlet, MD 08/24/24 [id number]    Doretha Folks, MD 08/25/24 [id number]

## 2024-08-24 NOTE — Discharge Instructions (Signed)
 Please follow-up with your primary care doctor about your neck discomfort and headaches.
